# Patient Record
Sex: Male | Born: 1945 | Race: White | Hispanic: No | Marital: Married | Smoking: Never smoker
Health system: Southern US, Community
[De-identification: ages and names within clinical notes are randomized; demographics above are authoritative.]

## PROBLEM LIST (undated history)

## (undated) DIAGNOSIS — Z974 Presence of external hearing-aid: Secondary | ICD-10-CM

## (undated) DIAGNOSIS — Z87442 Personal history of urinary calculi: Secondary | ICD-10-CM

## (undated) DIAGNOSIS — R519 Headache, unspecified: Secondary | ICD-10-CM

## (undated) DIAGNOSIS — R51 Headache: Secondary | ICD-10-CM

## (undated) DIAGNOSIS — Z86718 Personal history of other venous thrombosis and embolism: Secondary | ICD-10-CM

## (undated) DIAGNOSIS — M1990 Unspecified osteoarthritis, unspecified site: Secondary | ICD-10-CM

## (undated) HISTORY — PX: SURGERY OF LIP: SUR1315

## (undated) HISTORY — PX: HERNIA REPAIR: SHX51

## (undated) HISTORY — PX: FOOT SURGERY: SHX648

## (undated) HISTORY — DX: Personal history of other venous thrombosis and embolism: Z86.718

## (undated) HISTORY — PX: KNEE SURGERY: SHX244

## (undated) HISTORY — PX: COLONOSCOPY: SHX174

---

## 2008-01-31 ENCOUNTER — Ambulatory Visit: Payer: Self-pay | Admitting: Surgery

## 2008-02-07 ENCOUNTER — Ambulatory Visit: Payer: Self-pay | Admitting: Surgery

## 2011-05-28 ENCOUNTER — Ambulatory Visit: Payer: Self-pay | Admitting: Anesthesiology

## 2011-05-29 ENCOUNTER — Ambulatory Visit: Payer: Self-pay | Admitting: Podiatry

## 2011-11-19 ENCOUNTER — Emergency Department: Payer: Self-pay | Admitting: Emergency Medicine

## 2011-11-24 ENCOUNTER — Ambulatory Visit: Payer: Self-pay | Admitting: Unknown Physician Specialty

## 2011-11-28 ENCOUNTER — Ambulatory Visit: Payer: Self-pay | Admitting: Unknown Physician Specialty

## 2012-04-18 DIAGNOSIS — R972 Elevated prostate specific antigen [PSA]: Secondary | ICD-10-CM | POA: Insufficient documentation

## 2012-04-18 DIAGNOSIS — N138 Other obstructive and reflux uropathy: Secondary | ICD-10-CM | POA: Insufficient documentation

## 2012-04-18 DIAGNOSIS — N401 Enlarged prostate with lower urinary tract symptoms: Secondary | ICD-10-CM | POA: Insufficient documentation

## 2013-01-08 ENCOUNTER — Ambulatory Visit: Payer: Self-pay | Admitting: Family Medicine

## 2013-01-14 ENCOUNTER — Ambulatory Visit: Payer: Self-pay | Admitting: Internal Medicine

## 2013-09-28 DIAGNOSIS — M7542 Impingement syndrome of left shoulder: Secondary | ICD-10-CM | POA: Insufficient documentation

## 2013-09-28 DIAGNOSIS — M25512 Pain in left shoulder: Secondary | ICD-10-CM | POA: Insufficient documentation

## 2013-10-14 ENCOUNTER — Ambulatory Visit: Payer: Self-pay | Admitting: Gastroenterology

## 2014-07-11 ENCOUNTER — Other Ambulatory Visit: Payer: Self-pay | Admitting: Family Medicine

## 2014-07-11 DIAGNOSIS — R1011 Right upper quadrant pain: Secondary | ICD-10-CM

## 2014-07-18 ENCOUNTER — Ambulatory Visit
Admission: RE | Admit: 2014-07-18 | Discharge: 2014-07-18 | Disposition: A | Discharge status: Home / Self Care | Payer: Medicare Other | Source: Ambulatory Visit | Attending: Family Medicine | Admitting: Family Medicine

## 2014-07-18 DIAGNOSIS — R1011 Right upper quadrant pain: Secondary | ICD-10-CM | POA: Diagnosis not present

## 2014-07-21 ENCOUNTER — Other Ambulatory Visit: Payer: Self-pay | Admitting: Family Medicine

## 2014-07-21 DIAGNOSIS — R1011 Right upper quadrant pain: Secondary | ICD-10-CM

## 2014-07-28 ENCOUNTER — Ambulatory Visit
Admission: RE | Admit: 2014-07-28 | Discharge: 2014-07-28 | Disposition: A | Discharge status: Home / Self Care | Payer: Medicare Other | Source: Ambulatory Visit | Attending: Family Medicine | Admitting: Family Medicine

## 2014-07-28 DIAGNOSIS — R1011 Right upper quadrant pain: Secondary | ICD-10-CM | POA: Insufficient documentation

## 2014-07-28 MED ORDER — TECHNETIUM TC 99M MEBROFENIN IV KIT
5.0000 | PACK | Freq: Once | INTRAVENOUS | Status: AC | PRN
  Administered 2014-07-28: 5 via INTRAVENOUS

## 2014-07-28 MED ORDER — SINCALIDE 5 MCG IJ SOLR
0.0200 ug/kg | Freq: Once | INTRAMUSCULAR | Status: AC
  Administered 2014-07-28: 1.2 ug via INTRAVENOUS

## 2014-09-25 ENCOUNTER — Other Ambulatory Visit: Payer: Self-pay | Admitting: Family Medicine

## 2014-09-25 DIAGNOSIS — R1011 Right upper quadrant pain: Secondary | ICD-10-CM

## 2014-09-29 ENCOUNTER — Ambulatory Visit
Admission: RE | Admit: 2014-09-29 | Discharge: 2014-09-29 | Disposition: A | Discharge status: Home / Self Care | Payer: Medicare Other | Source: Ambulatory Visit | Attending: Family Medicine | Admitting: Family Medicine

## 2014-09-29 ENCOUNTER — Other Ambulatory Visit: Payer: Self-pay | Admitting: Family Medicine

## 2014-09-29 DIAGNOSIS — R1011 Right upper quadrant pain: Secondary | ICD-10-CM | POA: Diagnosis present

## 2014-09-29 DIAGNOSIS — M47814 Spondylosis without myelopathy or radiculopathy, thoracic region: Secondary | ICD-10-CM | POA: Insufficient documentation

## 2014-09-29 DIAGNOSIS — M5134 Other intervertebral disc degeneration, thoracic region: Secondary | ICD-10-CM | POA: Diagnosis not present

## 2014-09-29 DIAGNOSIS — I2510 Atherosclerotic heart disease of native coronary artery without angina pectoris: Secondary | ICD-10-CM | POA: Diagnosis not present

## 2014-09-29 MED ORDER — IOHEXOL 300 MG/ML  SOLN
100.0000 mL | Freq: Once | INTRAMUSCULAR | Status: AC | PRN
  Administered 2014-09-29: 100 mL via INTRAVENOUS

## 2016-03-17 ENCOUNTER — Telehealth: Payer: Self-pay

## 2016-03-17 ENCOUNTER — Other Ambulatory Visit: Payer: Self-pay

## 2016-03-17 DIAGNOSIS — Z86010 Personal history of colonic polyps: Secondary | ICD-10-CM

## 2016-03-17 NOTE — Telephone Encounter (Signed)
Gastroenterology Pre-Procedure Review  Request Date: 03/27/16 Requesting Physician: Dr. Allen Norris  PATIENT REVIEW QUESTIONS: The patient responded to the following health history questions as indicated:    1. Are you having any GI issues? no 2. Do you have a personal history of Polyps? yes (precancerous) 3. Do you have a family history of Colon Cancer or Polyps? no 4. Diabetes Mellitus? no 5. Joint replacements in the past 12 months?no 6. Major health problems in the past 3 months?no 7. Any artificial heart valves, MVP, or defibrillator?no    MEDICATIONS & ALLERGIES:    Patient reports the following regarding taking any anticoagulation/antiplatelet therapy:   Plavix, Coumadin, Eliquis, Xarelto, Lovenox, Pradaxa, Brilinta, or Effient? no Aspirin? no  Patient confirms/reports the following medications:  Current Outpatient Prescriptions  Medication Sig Dispense Refill  . calcium carbonate (CALCIUM 600) 600 MG TABS tablet Take 600 mg by mouth.    . calcium-vitamin D (CALCIUM 500/D) 500-200 MG-UNIT tablet Take by mouth.    . Cholecalciferol (VITAMIN D3) 1000 units CAPS Take by mouth.    . dutasteride (AVODART) 0.5 MG capsule Take 0.5 mg by mouth.    . Glucosamine Sulfate 500 MG TABS Take by mouth.     No current facility-administered medications for this visit.     Patient confirms/reports the following allergies:  Allergies  Allergen Reactions  . Sulfa Antibiotics Rash    No orders of the defined types were placed in this encounter.   AUTHORIZATION INFORMATION Primary Insurance: 1D#: Group #:  Secondary Insurance: 1D#: Group #:  SCHEDULE INFORMATION: Date: 03/27/16 Time: Location: [place]

## 2016-03-17 NOTE — Addendum Note (Signed)
Addended by: Peggye Ley on: 03/17/2016 11:44 AM   Modules accepted: Orders, SmartSet

## 2016-03-24 ENCOUNTER — Encounter: Payer: Self-pay | Admitting: *Deleted

## 2016-03-26 NOTE — Discharge Instructions (Signed)

## 2016-03-27 ENCOUNTER — Ambulatory Visit: Payer: Medicare Other | Admitting: Anesthesiology

## 2016-03-27 ENCOUNTER — Ambulatory Visit
Admission: RE | Admit: 2016-03-27 | Discharge: 2016-03-27 | Disposition: A | Discharge status: Home / Self Care | Payer: Medicare Other | Source: Ambulatory Visit | Attending: Gastroenterology | Admitting: Gastroenterology

## 2016-03-27 ENCOUNTER — Encounter
Admission: RE | Disposition: A | Discharge status: Home / Self Care | Payer: Self-pay | Source: Ambulatory Visit | Attending: Gastroenterology

## 2016-03-27 DIAGNOSIS — K5730 Diverticulosis of large intestine without perforation or abscess without bleeding: Secondary | ICD-10-CM | POA: Insufficient documentation

## 2016-03-27 DIAGNOSIS — K635 Polyp of colon: Secondary | ICD-10-CM

## 2016-03-27 DIAGNOSIS — D125 Benign neoplasm of sigmoid colon: Secondary | ICD-10-CM | POA: Insufficient documentation

## 2016-03-27 DIAGNOSIS — Z1211 Encounter for screening for malignant neoplasm of colon: Secondary | ICD-10-CM | POA: Insufficient documentation

## 2016-03-27 DIAGNOSIS — Z860100 Personal history of colon polyps, unspecified: Secondary | ICD-10-CM

## 2016-03-27 DIAGNOSIS — D122 Benign neoplasm of ascending colon: Secondary | ICD-10-CM | POA: Diagnosis not present

## 2016-03-27 DIAGNOSIS — Z79899 Other long term (current) drug therapy: Secondary | ICD-10-CM | POA: Insufficient documentation

## 2016-03-27 DIAGNOSIS — D123 Benign neoplasm of transverse colon: Secondary | ICD-10-CM

## 2016-03-27 DIAGNOSIS — K641 Second degree hemorrhoids: Secondary | ICD-10-CM | POA: Diagnosis not present

## 2016-03-27 HISTORY — PX: COLONOSCOPY WITH PROPOFOL: SHX5780

## 2016-03-27 HISTORY — DX: Presence of external hearing-aid: Z97.4

## 2016-03-27 HISTORY — PX: POLYPECTOMY: SHX5525

## 2016-03-27 SURGERY — COLONOSCOPY WITH PROPOFOL
Anesthesia: Monitor Anesthesia Care | Wound class: Contaminated

## 2016-03-27 MED ORDER — STERILE WATER FOR IRRIGATION IR SOLN
Status: DC | PRN
  Administered 2016-03-27: 10:00:00

## 2016-03-27 MED ORDER — LACTATED RINGERS IV SOLN
INTRAVENOUS | Status: DC | PRN
  Administered 2016-03-27: 10:00:00 via INTRAVENOUS

## 2016-03-27 MED ORDER — PROPOFOL 10 MG/ML IV BOLUS
INTRAVENOUS | Status: DC | PRN
  Administered 2016-03-27: 10 mg via INTRAVENOUS
  Administered 2016-03-27 (×3): 20 mg via INTRAVENOUS
  Administered 2016-03-27: 70 mg via INTRAVENOUS
  Administered 2016-03-27: 20 mg via INTRAVENOUS

## 2016-03-27 MED ORDER — LIDOCAINE HCL (CARDIAC) 20 MG/ML IV SOLN
INTRAVENOUS | Status: DC | PRN
  Administered 2016-03-27: 40 mg via INTRAVENOUS

## 2016-03-27 SURGICAL SUPPLY — 23 items

## 2016-03-27 NOTE — Anesthesia Procedure Notes (Signed)
Procedure Name: MAC Performed by: Mayme Genta Pre-anesthesia Checklist: Patient identified, Emergency Drugs available, Suction available, Timeout performed and Patient being monitored Patient Re-evaluated:Patient Re-evaluated prior to inductionOxygen Delivery Method: Nasal cannula Placement Confirmation: positive ETCO2

## 2016-03-27 NOTE — H&P (Signed)
  Lucilla Lame, MD [address] [place], [place] [address] Phone: [phone] Fax : [phone]  Primary Care Physician:  Sherrin Daisy, MD Primary Gastroenterologist:  Dr. Allen Norris  Pre-Procedure History & Physical: HPI:  Tyler Woods. is a 71 y.o. male is here for an colonoscopy.   Past Medical History:  Diagnosis Date  . Hearing aid worn    bilateral    Past Surgical History:  Procedure Laterality Date  . COLONOSCOPY    . FOOT SURGERY Right   . HERNIA REPAIR    . KNEE SURGERY Right     Prior to Admission medications   Medication Sig Start Date End Date Taking? Authorizing Provider  calcium carbonate (CALCIUM 600) 600 MG TABS tablet Take 600 mg by mouth.   Yes Historical Provider, MD  calcium-vitamin D (CALCIUM 500/D) 500-200 MG-UNIT tablet Take by mouth.   Yes Historical Provider, MD  Cholecalciferol (VITAMIN D3) 1000 units CAPS Take by mouth.   Yes Historical Provider, MD  dutasteride (AVODART) 0.5 MG capsule Take 0.5 mg by mouth. 05/05/14  Yes Historical Provider, MD  Glucosamine Sulfate 500 MG TABS Take by mouth.   Yes Historical Provider, MD    Allergies as of 03/17/2016 - Review Complete 09/29/2014  Allergen Reaction Noted  . Sulfa antibiotics Rash 09/16/2013    History reviewed. No pertinent family history.  Social History   Social History  . Marital status: Married    Spouse name: N/A  . Number of children: N/A  . Years of education: N/A   Occupational History  . Not on file.   Social History Main Topics  . Smoking status: Never Smoker  . Smokeless tobacco: Never Used  . Alcohol use No  . Drug use: Unknown  . Sexual activity: Not on file   Other Topics Concern  . Not on file   Social History Narrative  . No narrative on file    Review of Systems: See HPI, otherwise negative ROS  Physical Exam: BP (!) 161/92   Pulse 73   Temp 97.5 F (36.4 C) (Temporal)   Ht 5\' 7"  (1.702 m)   Wt 131 lb (59.4 kg)   SpO2 100%    BMI 20.52 kg/m  General:   Alert,  pleasant and cooperative in NAD Head:  Normocephalic and atraumatic. Neck:  Supple; no masses or thyromegaly. Lungs:  Clear throughout to auscultation.    Heart:  Regular rate and rhythm. Abdomen:  Soft, nontender and nondistended. Normal bowel sounds, without guarding, and without rebound.   Neurologic:  Alert and  oriented x4;  grossly normal neurologically.  Impression/Plan: [name]. is here for an colonoscopy to be performed for history of polyps  Risks, benefits, limitations, and alternatives regarding  colonoscopy have been reviewed with the patient.  Questions have been answered.  All parties agreeable.   Lucilla Lame, MD  03/27/2016, 9:25 AM

## 2016-03-27 NOTE — Op Note (Signed)
[facility] Gastroenterology Patient Name: Tyler Woods Procedure Date: 03/27/2016 9:26 AM MRN: [id number] Account #: [id number] Date of Birth: 06-20-1945 Admit Type: Outpatient Age: 71 Room: [facility] OR ROOM 01 Gender: Male Note Status: Finalized Procedure:            Colonoscopy Indications:          Surveillance: Personal history of adenomatous polyps on                        last colonoscopy 3 years ago Providers:            Lucilla Lame MD, MD Referring MD:         Shirline Frees MD, MD (Referring MD) Medicines:            Propofol per Anesthesia Complications:        No immediate complications. Procedure:            Pre-Anesthesia Assessment:                       - Prior to the procedure, a History and Physical was                        performed, and patient medications and allergies were                        reviewed. The patient's tolerance of previous                        anesthesia was also reviewed. The risks and benefits of                        the procedure and the sedation options and risks were                        discussed with the patient. All questions were                        answered, and informed consent was obtained. Prior                        Anticoagulants: The patient has taken no previous                        anticoagulant or antiplatelet agents. ASA Grade                        Assessment: II - A patient with mild systemic disease.                        After reviewing the risks and benefits, the patient was                        deemed in satisfactory condition to undergo the                        procedure.                       After obtaining informed consent, the colonoscope was  passed under direct vision. Throughout the procedure,                        the patient's blood pressure, pulse, and oxygen                        saturations were monitored continuously. The Olympus CF                  H180AL Colonoscope (S#: [id number]) was introduced through                        the anus and advanced to the the cecum, identified by                        appendiceal orifice and ileocecal valve. The                        colonoscopy was performed without difficulty. The                        patient tolerated the procedure well. The quality of                        the bowel preparation was excellent. Findings:      The perianal and digital rectal examinations were normal.      Two sessile polyps were found in the transverse colon. The polyps were 4       to 5 mm in size. These polyps were removed with a cold biopsy forceps.       Resection and retrieval were complete.      A 3 mm polyp was found in the ascending colon. The polyp was sessile.       The polyp was removed with a cold biopsy forceps. Resection and       retrieval were complete.      A 6 mm polyp was found in the sigmoid colon. The polyp was sessile. The       polyp was removed with a cold biopsy forceps. Resection and retrieval       were complete.      Multiple small-mouthed diverticula were found in the sigmoid colon.      Non-bleeding internal hemorrhoids were found during retroflexion. The       hemorrhoids were Grade II (internal hemorrhoids that prolapse but reduce       spontaneously). Impression:           - Two 4 to 5 mm polyps in the transverse colon, removed                        with a cold biopsy forceps. Resected and retrieved.                       - One 3 mm polyp in the ascending colon, removed with a                        cold biopsy forceps. Resected and retrieved.                       - One 6 mm polyp in the sigmoid colon, removed with a  cold biopsy forceps. Resected and retrieved.                       - Diverticulosis in the sigmoid colon.                       - Non-bleeding internal hemorrhoids. Recommendation:       - Discharge patient to home.                        - Resume previous diet.                       - Continue present medications.                       - Await pathology results.                       - Repeat colonoscopy in 5 years for surveillance. Procedure Code(s):    --- Professional ---                       [id number], Colonoscopy, flexible; with biopsy, single or                        multiple Diagnosis Code(s):    --- Professional ---                       Z86.010, Personal history of colonic polyps                       D12.3, Benign neoplasm of transverse colon (hepatic                        flexure or splenic flexure)                       D12.2, Benign neoplasm of ascending colon                       D12.5, Benign neoplasm of sigmoid colon CPT copyright 2016 American Medical Association. All rights reserved. The codes documented in this report are preliminary and upon coder review may  be revised to meet current compliance requirements. Lucilla Lame MD, MD 03/27/2016 9:50:02 AM This report has been signed electronically. Number of Addenda: 0 Note Initiated On: 03/27/2016 9:26 AM Scope Withdrawal Time: 0 hours 8 minutes 9 seconds  Total Procedure Duration: 0 hours 11 minutes 0 seconds       [facility]

## 2016-03-27 NOTE — Anesthesia Postprocedure Evaluation (Signed)
Anesthesia Post Note  Patient: Tyler Woods.  Procedure(s) Performed: Procedure(s) (LRB): COLONOSCOPY WITH PROPOFOL (N/A) POLYPECTOMY  Patient location during evaluation: PACU Anesthesia Type: MAC Level of consciousness: awake and alert Pain management: pain level controlled Vital Signs Assessment: post-procedure vital signs reviewed and stable Respiratory status: spontaneous breathing, nonlabored ventilation, respiratory function stable and patient connected to nasal cannula oxygen Cardiovascular status: stable and blood pressure returned to baseline Anesthetic complications: no    Alisa Graff

## 2016-03-27 NOTE — Anesthesia Preprocedure Evaluation (Signed)
Anesthesia Evaluation  Patient identified by MRN, date of birth, ID band Patient awake    Reviewed: Allergy & Precautions, H&P , NPO status , Patient's Chart, lab work & pertinent test results, reviewed documented beta blocker date and time   Airway Mallampati: II  TM Distance: >3 FB Neck ROM: full    Dental no notable dental hx.    Pulmonary neg pulmonary ROS,    Pulmonary exam normal breath sounds clear to auscultation       Cardiovascular Exercise Tolerance: Good negative cardio ROS   Rhythm:regular Rate:Normal     Neuro/Psych HOH, bilateral hearing aides  negative psych ROS   GI/Hepatic negative GI ROS, Neg liver ROS,   Endo/Other  negative endocrine ROS  Renal/GU negative Renal ROS  negative genitourinary   Musculoskeletal   Abdominal   Peds  Hematology negative hematology ROS (+)   Anesthesia Other Findings   Reproductive/Obstetrics negative OB ROS                             Anesthesia Physical Anesthesia Plan  ASA: II  Anesthesia Plan: MAC   Post-op Pain Management:    Induction:   Airway Management Planned:   Additional Equipment:   Intra-op Plan:   Post-operative Plan:   Informed Consent: I have reviewed the patients History and Physical, chart, labs and discussed the procedure including the risks, benefits and alternatives for the proposed anesthesia with the patient or authorized representative who has indicated his/her understanding and acceptance.   Dental Advisory Given  Plan Discussed with: CRNA  Anesthesia Plan Comments:         Anesthesia Quick Evaluation

## 2016-03-27 NOTE — Transfer of Care (Signed)
Immediate Anesthesia Transfer of Care Note  Patient: Tyler Woods.  Procedure(s) Performed: Procedure(s): COLONOSCOPY WITH PROPOFOL (N/A) POLYPECTOMY  Patient Location: PACU  Anesthesia Type: MAC  Level of Consciousness: awake, alert  and patient cooperative  Airway and Oxygen Therapy: Patient Spontanous Breathing and Patient connected to supplemental oxygen  Post-op Assessment: Post-op Vital signs reviewed, Patient's Cardiovascular Status Stable, Respiratory Function Stable, Patent Airway and No signs of Nausea or vomiting  Post-op Vital Signs: Reviewed and stable  Complications: No apparent anesthesia complications

## 2016-03-28 ENCOUNTER — Encounter: Payer: Self-pay | Admitting: Gastroenterology

## 2016-03-31 ENCOUNTER — Encounter: Payer: Self-pay | Admitting: Gastroenterology

## 2017-07-01 ENCOUNTER — Ambulatory Visit: Payer: Medicare Other | Admitting: Urology

## 2017-07-01 ENCOUNTER — Encounter: Payer: Self-pay | Admitting: Urology

## 2017-07-01 VITALS — BP 152/80 | HR 68 | Ht 67.9 in | Wt 137.1 lb

## 2017-07-01 DIAGNOSIS — R972 Elevated prostate specific antigen [PSA]: Secondary | ICD-10-CM

## 2017-07-01 NOTE — Progress Notes (Signed)
07/01/2017 11:05 AM   Tyler Mart. 06-Oct-1945 [id number]  Referring provider: Sherrin Daisy, MD No address on file  Chief Complaint  Patient presents with  . Benign Prostatic Hypertrophy  . Elevated PSA   Urologic problem list: -Elevated PSA; biopsies 2001 and 2002 for PSAs of 4.2 and 7.8 respectively with benign pathology.  Prostate MRI 05/2015 showed no suspicious lesions  -BPH with lower urinary tract symptoms; started dutasteride 3618  HPI: 72 year old male presents for follow-up of the above problem list.  I last saw him at [facility] in May 2018.  Uncorrected PSA at that visit was stable at 2.59.  He denies bothersome lower urinary tract symptoms.  He has mild intermittency and urinary hesitancy.  Denies dysuria or gross hematuria.  Denies flank, abdominal, pelvic or scrotal pain.   PMH: Past Medical History:  Diagnosis Date  . Hearing aid worn    bilateral  . History of DVT (deep vein thrombosis)     Surgical History: Past Surgical History:  Procedure Laterality Date  . COLONOSCOPY    . COLONOSCOPY WITH PROPOFOL N/A 03/27/2016   Procedure: COLONOSCOPY WITH PROPOFOL;  Surgeon: Lucilla Lame, MD;  Location: [place];  Service: Endoscopy;  Laterality: N/A;  . FOOT SURGERY Right   . HERNIA REPAIR    . KNEE SURGERY Right   . POLYPECTOMY  03/27/2016   Procedure: POLYPECTOMY;  Surgeon: Lucilla Lame, MD;  Location: [facility];  Service: Endoscopy;;    Home Medications:  Allergies as of 07/01/2017      Reactions   Sulfa Antibiotics Rash      Medication List        Accurate as of 07/01/17 11:05 AM. Always use your most recent med list.          CALCIUM 500/D 500-200 MG-UNIT tablet Generic drug:  calcium-vitamin D Take by mouth.   CENTRUM SILVER 50+MEN PO Take by mouth.   dutasteride 0.5 MG capsule Commonly known as:  AVODART Take 0.5 mg by mouth.   Vitamin D3 1000 units Caps Take by mouth.       Allergies:  Allergies    Allergen Reactions  . Sulfa Antibiotics Rash    Family History: Family History  Problem Relation Age of Onset  . Parkinson's disease Father     Social History:  reports that he has never smoked. He has never used smokeless tobacco. He reports that he has current or past drug history. He reports that he does not drink alcohol.  ROS: UROLOGY Frequent Urination?: Yes Hard to postpone urination?: No Burning/pain with urination?: No Get up at night to urinate?: No Leakage of urine?: No Urine stream starts and stops?: Yes Trouble starting stream?: Yes Do you have to strain to urinate?: No Blood in urine?: No Urinary tract infection?: No Sexually transmitted disease?: No Injury to kidneys or bladder?: No Painful intercourse?: No Weak stream?: No Erection problems?: No Penile pain?: No  Gastrointestinal Nausea?: No Vomiting?: No Indigestion/heartburn?: No Diarrhea?: No Constipation?: No  Constitutional Fever: No Night sweats?: No Weight loss?: No Fatigue?: No  Skin Skin rash/lesions?: No Itching?: No  Eyes Blurred vision?: No Double vision?: No  Ears/Nose/Throat Sore throat?: No Sinus problems?: No  Hematologic/Lymphatic Swollen glands?: No Easy bruising?: No  Cardiovascular Leg swelling?: No Chest pain?: No  Respiratory Cough?: No Shortness of breath?: No  Endocrine Excessive thirst?: No  Musculoskeletal Back pain?: No Joint pain?: No  Neurological Headaches?: No Dizziness?: No  Psychologic Depression?: No Anxiety?:  No  Physical Exam: BP (!) 152/80 (BP Location: Left Arm, Patient Position: Sitting, Cuff Size: Normal)   Pulse 68   Ht 5' 7.9" (1.725 m)   Wt 137 lb 1.6 oz (62.2 kg)   SpO2 99%   BMI 20.91 kg/m   Constitutional:  Alert and oriented, No acute distress. HEENT: [place] AT, moist mucus membranes.  Trachea midline, no masses. Cardiovascular: No clubbing, cyanosis, or edema. Respiratory: Normal respiratory effort, no increased  work of breathing. GI: Abdomen is soft, nontender, nondistended, no abdominal masses GU: No CVA tenderness.  Prostate 60 g, smooth without nodules Lymph: No cervical or inguinal lymphadenopathy. Skin: No rashes, bruises or suspicious lesions. Neurologic: Grossly intact, no focal deficits, moving all 4 extremities. Psychiatric: Normal mood and affect.   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  His dutasteride was refilled.  PSA was drawn today and if stable he will continue annual follow-up.    Abbie Sons, [place] [address], [place], [place] [address] [phone]

## 2017-07-02 ENCOUNTER — Telehealth: Payer: Self-pay | Admitting: Urology

## 2017-07-02 LAB — PSA: PROSTATE SPECIFIC AG, SERUM: 2.7 ng/mL (ref 0.0–4.0)

## 2017-07-02 NOTE — Telephone Encounter (Signed)
Pharmacy called office stating pt says a Rx was sent in after his appt. Pharmacy states they have not received a Rx from our office.  Notes indicate " His dutasteride was refilled.  "  Please advise to [facility] and patient. Thanks.

## 2017-07-03 ENCOUNTER — Telehealth: Payer: Self-pay

## 2017-07-03 ENCOUNTER — Encounter: Payer: Self-pay | Admitting: Urology

## 2017-07-03 MED ORDER — DUTASTERIDE 0.5 MG PO CAPS: 0.5000 mg | ORAL_CAPSULE | Freq: Every day | ORAL | 3 refills | Status: DC

## 2017-07-03 NOTE — Telephone Encounter (Signed)
rx has been sent 

## 2017-07-03 NOTE — Telephone Encounter (Signed)
-----   Message from Abbie Sons, MD sent at 07/03/2017  9:19 AM EDT ----- PSA stable at 2.7

## 2017-07-03 NOTE — Telephone Encounter (Signed)
Called pt informed him of lab results.

## 2017-08-25 ENCOUNTER — Other Ambulatory Visit: Payer: Self-pay | Admitting: Unknown Physician Specialty

## 2017-08-25 DIAGNOSIS — M25511 Pain in right shoulder: Secondary | ICD-10-CM

## 2017-09-03 ENCOUNTER — Ambulatory Visit
Admission: RE | Admit: 2017-09-03 | Discharge: 2017-09-03 | Disposition: A | Discharge status: Home / Self Care | Payer: Medicare Other | Source: Ambulatory Visit | Attending: Unknown Physician Specialty | Admitting: Unknown Physician Specialty

## 2017-09-03 DIAGNOSIS — M19011 Primary osteoarthritis, right shoulder: Secondary | ICD-10-CM | POA: Insufficient documentation

## 2017-09-03 DIAGNOSIS — M25511 Pain in right shoulder: Secondary | ICD-10-CM | POA: Insufficient documentation

## 2017-09-03 DIAGNOSIS — M75121 Complete rotator cuff tear or rupture of right shoulder, not specified as traumatic: Secondary | ICD-10-CM | POA: Insufficient documentation

## 2017-09-25 ENCOUNTER — Other Ambulatory Visit: Payer: Self-pay

## 2017-09-25 ENCOUNTER — Encounter
Admission: RE | Admit: 2017-09-25 | Discharge: 2017-09-25 | Disposition: A | Discharge status: Home / Self Care | Payer: Medicare Other | Source: Ambulatory Visit | Attending: Surgery | Admitting: Surgery

## 2017-09-25 DIAGNOSIS — Z01818 Encounter for other preprocedural examination: Secondary | ICD-10-CM | POA: Diagnosis not present

## 2017-09-25 DIAGNOSIS — X58XXXA Exposure to other specified factors, initial encounter: Secondary | ICD-10-CM | POA: Insufficient documentation

## 2017-09-25 DIAGNOSIS — S46091A Other injury of muscle(s) and tendon(s) of the rotator cuff of right shoulder, initial encounter: Secondary | ICD-10-CM | POA: Insufficient documentation

## 2017-09-25 DIAGNOSIS — R001 Bradycardia, unspecified: Secondary | ICD-10-CM | POA: Diagnosis not present

## 2017-09-25 LAB — URINALYSIS, ROUTINE W REFLEX MICROSCOPIC
Bilirubin Urine: NEGATIVE
Glucose, UA: NEGATIVE mg/dL
HGB URINE DIPSTICK: NEGATIVE
KETONES UR: NEGATIVE mg/dL
Leukocytes, UA: NEGATIVE
Nitrite: NEGATIVE
Protein, ur: NEGATIVE mg/dL
Specific Gravity, Urine: 1.019 (ref 1.005–1.030)
pH: 6 (ref 5.0–8.0)

## 2017-09-25 LAB — SURGICAL PCR SCREEN
MRSA, PCR: NEGATIVE
Staphylococcus aureus: NEGATIVE

## 2017-09-25 LAB — CBC
HCT: 38.2 % — ABNORMAL LOW (ref 40.0–52.0)
Hemoglobin: 13.2 g/dL (ref 13.0–18.0)
MCH: 31.9 pg (ref 26.0–34.0)
MCHC: 34.5 g/dL (ref 32.0–36.0)
MCV: 92.5 fL (ref 80.0–100.0)
PLATELETS: 180 10*3/uL (ref 150–440)
RBC: 4.13 MIL/uL — AB (ref 4.40–5.90)
RDW: 13.8 % (ref 11.5–14.5)
WBC: 7.4 10*3/uL (ref 3.8–10.6)

## 2017-09-25 LAB — BASIC METABOLIC PANEL
Anion gap: 4 — ABNORMAL LOW (ref 5–15)
BUN: 17 mg/dL (ref 8–23)
CALCIUM: 9.2 mg/dL (ref 8.9–10.3)
CO2: 29 mmol/L (ref 22–32)
Chloride: 105 mmol/L (ref 98–111)
Creatinine, Ser: 0.72 mg/dL (ref 0.61–1.24)
GFR calc Af Amer: >60 mL/min (ref >60)
Glucose, Bld: 99 mg/dL (ref 70–99)
POTASSIUM: 4.1 mmol/L (ref 3.5–5.1)
SODIUM: 138 mmol/L (ref 135–145)

## 2017-09-25 LAB — TYPE AND SCREEN
ABO/RH(D): A POS
ANTIBODY SCREEN: NEGATIVE

## 2017-09-25 NOTE — Patient Instructions (Signed)
Your procedure is scheduled on: Tues 9/24 Report to Day surgery. To find out your arrival time please call [phone] between 1PM - 3PM on Mon.9/23.  Remember: Instructions that are not followed completely may result in serious medical risk,  up to and including death, or upon the discretion of your surgeon and anesthesiologist your  surgery may need to be rescheduled.     _X__ 1. Do not eat food after midnight the night before your procedure.                 No gum chewing or hard candies. You may drink clear liquids up to 2 hours                 before you are scheduled to arrive for your surgery- DO not drink clear                 liquids within 2 hours of the start of your surgery.                 Clear Liquids include:  water, apple juice without pulp, clear carbohydrate                 drink such as Clearfast of Gatorade, Black Coffee or Tea (Do not add                 anything to coffee or tea).  __X__2.  On the morning of surgery brush your teeth with toothpaste and water, you                may rinse your mouth with mouthwash if you wish.  Do not swallow any toothpaste of mouthwash.     ___ 3.  No Alcohol for 24 hours before or after surgery.   ___ 4.  Do Not Smoke or use e-cigarettes For 24 Hours Prior to Your Surgery.                 Do not use any chewable tobacco products for at least 6 hours prior to                 surgery.  ____  5.  Bring all medications with you on the day of surgery if instructed.   __x__  6.  Notify your doctor if there is any change in your medical condition      (cold, fever, infections).     Do not wear jewelry, make-up, hairpins, clips or nail polish. Do not wear lotions, powders, or perfumes. You may wear deodorant. Do not shave 48 hours prior to surgery. Men may shave face and neck. Do not bring valuables to the hospital.    [facility] is not responsible for any belongings or valuables.  Contacts, dentures or  bridgework may not be worn into surgery. Leave your suitcase in the car. After surgery it may be brought to your room. For patients admitted to the hospital, discharge time is determined by your treatment team.   Patients discharged the day of surgery will not be allowed to drive home.   Please read over the following fact sheets that you were given:    __x__ Take these medicines the morning of surgery with A SIP OF WATER:    1. none  2.   3.   4.  5.  6.  ____ Fleet Enema (as directed)   _x___ Use CHG Soap as directed  ____ Use inhalers on the day of surgery  ____ Stop metformin 2 days prior to surgery    ____ Take 1/2 of usual insulin dose the night before surgery. No insulin the morning          of surgery.   __x__ Stop Excedrin 9/17  __x__ Stop Anti-inflammatories on ibuprofen or motrin or aleve or sdvil   ____ Stop supplements until after surgery.    ____ Bring C-Pap to the hospital.

## 2017-09-27 LAB — URINE CULTURE: CULTURE: NO GROWTH

## 2017-10-05 MED ORDER — CEFAZOLIN SODIUM-DEXTROSE 2-4 GM/100ML-% IV SOLN
2.0000 g | Freq: Once | INTRAVENOUS | Status: AC
  Administered 2017-10-06: 2 g via INTRAVENOUS

## 2017-10-06 ENCOUNTER — Inpatient Hospital Stay
Admission: RE | Admit: 2017-10-06 | Discharge: 2017-10-07 | DRG: 483 | Disposition: A | Discharge status: Home Health | Payer: Medicare Other | Source: Ambulatory Visit | Attending: Surgery | Admitting: Surgery

## 2017-10-06 ENCOUNTER — Inpatient Hospital Stay: Payer: Medicare Other

## 2017-10-06 ENCOUNTER — Other Ambulatory Visit: Payer: Self-pay

## 2017-10-06 ENCOUNTER — Encounter
Admission: RE | Disposition: A | Discharge status: Home Health | Payer: Self-pay | Source: Ambulatory Visit | Attending: Surgery

## 2017-10-06 ENCOUNTER — Inpatient Hospital Stay: Payer: Medicare Other | Admitting: Certified Registered Nurse Anesthetist

## 2017-10-06 DIAGNOSIS — Z86718 Personal history of other venous thrombosis and embolism: Secondary | ICD-10-CM

## 2017-10-06 DIAGNOSIS — Z96611 Presence of right artificial shoulder joint: Secondary | ICD-10-CM

## 2017-10-06 DIAGNOSIS — N400 Enlarged prostate without lower urinary tract symptoms: Secondary | ICD-10-CM | POA: Diagnosis present

## 2017-10-06 DIAGNOSIS — Z974 Presence of external hearing-aid: Secondary | ICD-10-CM | POA: Diagnosis not present

## 2017-10-06 DIAGNOSIS — S46011A Strain of muscle(s) and tendon(s) of the rotator cuff of right shoulder, initial encounter: Principal | ICD-10-CM | POA: Diagnosis present

## 2017-10-06 DIAGNOSIS — X58XXXA Exposure to other specified factors, initial encounter: Secondary | ICD-10-CM | POA: Diagnosis present

## 2017-10-06 HISTORY — PX: REVERSE SHOULDER ARTHROPLASTY: SHX5054

## 2017-10-06 LAB — ABO/RH: ABO/RH(D): A POS

## 2017-10-06 SURGERY — ARTHROPLASTY, SHOULDER, TOTAL, REVERSE
Anesthesia: Regional | Site: Shoulder | Laterality: Right

## 2017-10-06 MED ORDER — ADULT MULTIVITAMIN W/MINERALS CH
1.0000 | ORAL_TABLET | Freq: Every day | ORAL | Status: DC
  Filled 2017-10-06: qty 1

## 2017-10-06 MED ORDER — LIDOCAINE HCL (PF) 2 % IJ SOLN
INTRAMUSCULAR | Status: AC
  Filled 2017-10-06: qty 10

## 2017-10-06 MED ORDER — NEOMYCIN-POLYMYXIN B GU 40-200000 IR SOLN
Status: AC
  Filled 2017-10-06: qty 20

## 2017-10-06 MED ORDER — ASPIRIN-ACETAMINOPHEN-CAFFEINE 250-250-65 MG PO TABS
1.0000 | ORAL_TABLET | Freq: Two times a day (BID) | ORAL | Status: DC | PRN
  Filled 2017-10-06: qty 1

## 2017-10-06 MED ORDER — INFLUENZA VAC SPLIT HIGH-DOSE 0.5 ML IM SUSY
0.5000 mL | PREFILLED_SYRINGE | INTRAMUSCULAR | Status: DC
  Filled 2017-10-06: qty 0.5

## 2017-10-06 MED ORDER — ONDANSETRON HCL 4 MG/2ML IJ SOLN
INTRAMUSCULAR | Status: DC | PRN
  Administered 2017-10-06: 4 mg via INTRAVENOUS

## 2017-10-06 MED ORDER — FENTANYL CITRATE (PF) 100 MCG/2ML IJ SOLN
100.0000 ug | Freq: Once | INTRAMUSCULAR | Status: AC
  Administered 2017-10-06: 50 ug via INTRAVENOUS

## 2017-10-06 MED ORDER — PANTOPRAZOLE SODIUM 40 MG PO TBEC
40.0000 mg | DELAYED_RELEASE_TABLET | Freq: Every day | ORAL | Status: DC
  Administered 2017-10-06: 40 mg via ORAL
  Filled 2017-10-06 (×2): qty 1

## 2017-10-06 MED ORDER — KETOROLAC TROMETHAMINE 15 MG/ML IJ SOLN
7.5000 mg | Freq: Four times a day (QID) | INTRAMUSCULAR | Status: AC
  Administered 2017-10-06 – 2017-10-07 (×4): 7.5 mg via INTRAVENOUS
  Filled 2017-10-06 (×4): qty 1

## 2017-10-06 MED ORDER — CEFAZOLIN SODIUM-DEXTROSE 2-4 GM/100ML-% IV SOLN
2.0000 g | Freq: Four times a day (QID) | INTRAVENOUS | Status: AC
  Administered 2017-10-06 (×2): 2 g via INTRAVENOUS
  Filled 2017-10-06 (×4): qty 100

## 2017-10-06 MED ORDER — FENTANYL CITRATE (PF) 100 MCG/2ML IJ SOLN
INTRAMUSCULAR | Status: AC
  Administered 2017-10-06: 50 ug via INTRAVENOUS
  Filled 2017-10-06: qty 2

## 2017-10-06 MED ORDER — BUPIVACAINE HCL (PF) 0.5 % IJ SOLN
INTRAMUSCULAR | Status: DC | PRN
  Administered 2017-10-06: 10 mL via PERINEURAL

## 2017-10-06 MED ORDER — PHENYLEPHRINE HCL 10 MG/ML IJ SOLN
INTRAMUSCULAR | Status: DC | PRN
  Administered 2017-10-06: 50 ug via INTRAVENOUS
  Administered 2017-10-06: 100 ug via INTRAVENOUS

## 2017-10-06 MED ORDER — LIDOCAINE HCL (CARDIAC) PF 100 MG/5ML IV SOSY
PREFILLED_SYRINGE | INTRAVENOUS | Status: DC | PRN
  Administered 2017-10-06: 100 mg via INTRAVENOUS

## 2017-10-06 MED ORDER — FENTANYL CITRATE (PF) 250 MCG/5ML IJ SOLN
INTRAMUSCULAR | Status: AC
  Filled 2017-10-06: qty 5

## 2017-10-06 MED ORDER — ROCURONIUM BROMIDE 50 MG/5ML IV SOLN
INTRAVENOUS | Status: AC
  Filled 2017-10-06: qty 2

## 2017-10-06 MED ORDER — METOPROLOL TARTRATE 5 MG/5ML IV SOLN
INTRAVENOUS | Status: AC
  Filled 2017-10-06: qty 5

## 2017-10-06 MED ORDER — BUPIVACAINE-EPINEPHRINE (PF) 0.5% -1:200000 IJ SOLN
INTRAMUSCULAR | Status: DC | PRN
  Administered 2017-10-06: 30 mL via PERINEURAL

## 2017-10-06 MED ORDER — ONDANSETRON HCL 4 MG/2ML IJ SOLN: 4.0000 mg | Freq: Once | INTRAMUSCULAR | Status: DC | PRN

## 2017-10-06 MED ORDER — NEOMYCIN-POLYMYXIN B GU 40-200000 IR SOLN
Status: DC | PRN
  Administered 2017-10-06: 14 mL

## 2017-10-06 MED ORDER — VITAMIN D3 25 MCG (1000 UNIT) PO TABS
2000.0000 [IU] | ORAL_TABLET | Freq: Every day | ORAL | Status: DC
  Filled 2017-10-06 (×3): qty 2

## 2017-10-06 MED ORDER — SODIUM CHLORIDE 0.9 % IV SOLN
INTRAVENOUS | Status: DC
  Administered 2017-10-06 (×2): via INTRAVENOUS

## 2017-10-06 MED ORDER — KETOROLAC TROMETHAMINE 15 MG/ML IJ SOLN
INTRAMUSCULAR | Status: AC
  Administered 2017-10-06: 15 mg via INTRAVENOUS
  Filled 2017-10-06: qty 1

## 2017-10-06 MED ORDER — BUPIVACAINE-EPINEPHRINE (PF) 0.5% -1:200000 IJ SOLN
INTRAMUSCULAR | Status: AC
  Filled 2017-10-06: qty 30

## 2017-10-06 MED ORDER — HYDROMORPHONE HCL 1 MG/ML IJ SOLN: 0.2500 mg | INTRAMUSCULAR | Status: DC | PRN

## 2017-10-06 MED ORDER — FAMOTIDINE 20 MG PO TABS
ORAL_TABLET | ORAL | Status: AC
  Filled 2017-10-06: qty 1

## 2017-10-06 MED ORDER — ENOXAPARIN SODIUM 40 MG/0.4ML ~~LOC~~ SOLN
40.0000 mg | SUBCUTANEOUS | Status: DC
  Filled 2017-10-06: qty 0.4

## 2017-10-06 MED ORDER — DIPHENHYDRAMINE HCL 12.5 MG/5ML PO ELIX: 12.5000 mg | ORAL_SOLUTION | ORAL | Status: DC | PRN

## 2017-10-06 MED ORDER — MIDAZOLAM HCL 2 MG/2ML IJ SOLN
INTRAMUSCULAR | Status: AC
  Administered 2017-10-06: 1 mg via INTRAVENOUS
  Filled 2017-10-06: qty 2

## 2017-10-06 MED ORDER — METOCLOPRAMIDE HCL 10 MG PO TABS: 5.0000 mg | ORAL_TABLET | Freq: Three times a day (TID) | ORAL | Status: DC | PRN

## 2017-10-06 MED ORDER — BUPIVACAINE LIPOSOME 1.3 % IJ SUSP
INTRAMUSCULAR | Status: DC | PRN
  Administered 2017-10-06: 20 mL via PERINEURAL

## 2017-10-06 MED ORDER — TRANEXAMIC ACID 1000 MG/10ML IV SOLN
INTRAVENOUS | Status: DC | PRN
  Administered 2017-10-06: 1000 mg via INTRAVENOUS

## 2017-10-06 MED ORDER — TRANEXAMIC ACID 1000 MG/10ML IV SOLN
INTRAVENOUS | Status: AC
  Filled 2017-10-06: qty 10

## 2017-10-06 MED ORDER — LIDOCAINE HCL (PF) 1 % IJ SOLN
INTRAMUSCULAR | Status: AC
  Filled 2017-10-06: qty 5

## 2017-10-06 MED ORDER — PROPOFOL 10 MG/ML IV BOLUS
INTRAVENOUS | Status: AC
  Filled 2017-10-06: qty 20

## 2017-10-06 MED ORDER — MIDAZOLAM HCL 2 MG/2ML IJ SOLN
2.0000 mg | Freq: Once | INTRAMUSCULAR | Status: AC
  Administered 2017-10-06: 1 mg via INTRAVENOUS

## 2017-10-06 MED ORDER — PROPOFOL 10 MG/ML IV BOLUS
INTRAVENOUS | Status: DC | PRN
  Administered 2017-10-06: 160 mg via INTRAVENOUS

## 2017-10-06 MED ORDER — BUPIVACAINE HCL (PF) 0.5 % IJ SOLN
INTRAMUSCULAR | Status: AC
  Filled 2017-10-06: qty 10

## 2017-10-06 MED ORDER — ACETAMINOPHEN 500 MG PO TABS
1000.0000 mg | ORAL_TABLET | Freq: Four times a day (QID) | ORAL | Status: DC
  Administered 2017-10-06 – 2017-10-07 (×3): 1000 mg via ORAL
  Filled 2017-10-06 (×3): qty 2

## 2017-10-06 MED ORDER — SUGAMMADEX SODIUM 200 MG/2ML IV SOLN
INTRAVENOUS | Status: DC | PRN
  Administered 2017-10-06: 200 mg via INTRAVENOUS

## 2017-10-06 MED ORDER — ONDANSETRON HCL 4 MG/2ML IJ SOLN
4.0000 mg | Freq: Four times a day (QID) | INTRAMUSCULAR | Status: DC | PRN
  Administered 2017-10-06: 4 mg via INTRAVENOUS
  Filled 2017-10-06: qty 2

## 2017-10-06 MED ORDER — MAGNESIUM HYDROXIDE 400 MG/5ML PO SUSP: 30.0000 mL | Freq: Every day | ORAL | Status: DC | PRN

## 2017-10-06 MED ORDER — ONDANSETRON HCL 4 MG PO TABS: 4.0000 mg | ORAL_TABLET | Freq: Four times a day (QID) | ORAL | Status: DC | PRN

## 2017-10-06 MED ORDER — FLEET ENEMA 7-19 GM/118ML RE ENEM: 1.0000 | ENEMA | Freq: Once | RECTAL | Status: DC | PRN

## 2017-10-06 MED ORDER — ROCURONIUM BROMIDE 100 MG/10ML IV SOLN
INTRAVENOUS | Status: DC | PRN
  Administered 2017-10-06: 70 mg via INTRAVENOUS
  Administered 2017-10-06: 10 mg via INTRAVENOUS

## 2017-10-06 MED ORDER — FENTANYL CITRATE (PF) 100 MCG/2ML IJ SOLN: 25.0000 ug | INTRAMUSCULAR | Status: DC | PRN

## 2017-10-06 MED ORDER — DUTASTERIDE 0.5 MG PO CAPS
0.5000 mg | ORAL_CAPSULE | Freq: Every day | ORAL | Status: DC
  Administered 2017-10-06: 0.5 mg via ORAL
  Filled 2017-10-06 (×2): qty 1

## 2017-10-06 MED ORDER — METOCLOPRAMIDE HCL 5 MG/ML IJ SOLN
5.0000 mg | Freq: Three times a day (TID) | INTRAMUSCULAR | Status: DC | PRN
  Administered 2017-10-06: 10 mg via INTRAVENOUS
  Filled 2017-10-06: qty 2

## 2017-10-06 MED ORDER — FENTANYL CITRATE (PF) 100 MCG/2ML IJ SOLN
INTRAMUSCULAR | Status: DC | PRN
  Administered 2017-10-06 (×3): 25 ug via INTRAVENOUS
  Administered 2017-10-06: 50 ug via INTRAVENOUS
  Administered 2017-10-06 (×2): 25 ug via INTRAVENOUS

## 2017-10-06 MED ORDER — SUGAMMADEX SODIUM 200 MG/2ML IV SOLN
INTRAVENOUS | Status: AC
  Filled 2017-10-06: qty 2

## 2017-10-06 MED ORDER — OXYCODONE HCL 5 MG PO TABS: 5.0000 mg | ORAL_TABLET | ORAL | Status: DC | PRN

## 2017-10-06 MED ORDER — FAMOTIDINE 20 MG PO TABS
20.0000 mg | ORAL_TABLET | Freq: Once | ORAL | Status: AC
  Administered 2017-10-06: 20 mg via ORAL

## 2017-10-06 MED ORDER — BISACODYL 10 MG RE SUPP: 10.0000 mg | Freq: Every day | RECTAL | Status: DC | PRN

## 2017-10-06 MED ORDER — PHENYLEPHRINE HCL 10 MG/ML IJ SOLN
INTRAMUSCULAR | Status: AC
  Filled 2017-10-06: qty 1

## 2017-10-06 MED ORDER — LIDOCAINE HCL (PF) 1 % IJ SOLN
INTRAMUSCULAR | Status: DC | PRN
  Administered 2017-10-06: 3 mL via SUBCUTANEOUS

## 2017-10-06 MED ORDER — TRAMADOL HCL 50 MG PO TABS: 50.0000 mg | ORAL_TABLET | Freq: Four times a day (QID) | ORAL | Status: DC | PRN

## 2017-10-06 MED ORDER — KETOROLAC TROMETHAMINE 15 MG/ML IJ SOLN
15.0000 mg | Freq: Once | INTRAMUSCULAR | Status: AC
  Administered 2017-10-06: 15 mg via INTRAVENOUS

## 2017-10-06 MED ORDER — BUPIVACAINE LIPOSOME 1.3 % IJ SUSP
INTRAMUSCULAR | Status: AC
  Filled 2017-10-06: qty 20

## 2017-10-06 MED ORDER — ACETAMINOPHEN 325 MG PO TABS: 325.0000 mg | ORAL_TABLET | Freq: Four times a day (QID) | ORAL | Status: DC | PRN

## 2017-10-06 MED ORDER — CEFAZOLIN SODIUM-DEXTROSE 2-4 GM/100ML-% IV SOLN
INTRAVENOUS | Status: AC
  Filled 2017-10-06: qty 100

## 2017-10-06 MED ORDER — DOCUSATE SODIUM 100 MG PO CAPS
100.0000 mg | ORAL_CAPSULE | Freq: Two times a day (BID) | ORAL | Status: DC
  Administered 2017-10-06: 100 mg via ORAL
  Filled 2017-10-06 (×2): qty 1

## 2017-10-06 MED ORDER — METOPROLOL TARTRATE 5 MG/5ML IV SOLN
INTRAVENOUS | Status: DC | PRN
  Administered 2017-10-06 (×2): 2 mg via INTRAVENOUS

## 2017-10-06 MED ORDER — LACTATED RINGERS IV SOLN
INTRAVENOUS | Status: DC
  Administered 2017-10-06 (×2): via INTRAVENOUS

## 2017-10-06 MED ORDER — CALCIUM CARBONATE-VITAMIN D 500-200 MG-UNIT PO TABS
1.0000 | ORAL_TABLET | Freq: Every day | ORAL | Status: DC
  Filled 2017-10-06: qty 1

## 2017-10-06 MED ORDER — ONDANSETRON HCL 4 MG/2ML IJ SOLN
INTRAMUSCULAR | Status: AC
  Filled 2017-10-06: qty 2

## 2017-10-06 MED ORDER — SODIUM CHLORIDE 0.9 % IV SOLN
INTRAVENOUS | Status: DC | PRN
  Administered 2017-10-06: 30 ug/min via INTRAVENOUS

## 2017-10-06 SURGICAL SUPPLY — 64 items
BASEPLATE GLENOSPHERE 25 (Plate) ×2 IMPLANT
BEARING HUMERAL SHLDER 36M STD (Shoulder) ×1 IMPLANT
BIT DRILL TWIST 2.7 (BIT) ×2 IMPLANT
BLADE SAGITTAL WIDE XTHICK NO (BLADE) ×2 IMPLANT
CANISTER SUCT 1200ML W/VALVE (MISCELLANEOUS) ×2 IMPLANT
CANISTER SUCT 3000ML PPV (MISCELLANEOUS) ×4 IMPLANT
CHLORAPREP W/TINT 26ML (MISCELLANEOUS) ×2 IMPLANT
COOLER POLAR GLACIER W/PUMP (MISCELLANEOUS) ×2 IMPLANT
CRADLE LAMINECT ARM (MISCELLANEOUS) ×2 IMPLANT
DRAPE IMP U-DRAPE 54X76 (DRAPES) ×4 IMPLANT
DRAPE INCISE IOBAN 66X45 STRL (DRAPES) ×4 IMPLANT
DRAPE SHEET LG 3/4 BI-LAMINATE (DRAPES) ×4 IMPLANT
DRAPE TABLE BACK 80X90 (DRAPES) ×2 IMPLANT
DRSG OPSITE POSTOP 4X8 (GAUZE/BANDAGES/DRESSINGS) ×2 IMPLANT
ELECT BLADE 6.5 EXT (BLADE) IMPLANT
ELECT CAUTERY BLADE 6.4 (BLADE) ×2 IMPLANT
GLENOID SPHERE STD STRL 36MM (Orthopedic Implant) ×2 IMPLANT
GLOVE BIO SURGEON STRL SZ7.5 (GLOVE) ×8 IMPLANT
GLOVE BIO SURGEON STRL SZ8 (GLOVE) ×8 IMPLANT
GLOVE BIOGEL PI IND STRL 8 (GLOVE) ×1 IMPLANT
GLOVE BIOGEL PI INDICATOR 8 (GLOVE) ×1
GLOVE INDICATOR 8.0 STRL GRN (GLOVE) ×2 IMPLANT
GOWN STRL REUS W/ TWL LRG LVL3 (GOWN DISPOSABLE) ×1 IMPLANT
GOWN STRL REUS W/ TWL XL LVL3 (GOWN DISPOSABLE) ×1 IMPLANT
GOWN STRL REUS W/TWL LRG LVL3 (GOWN DISPOSABLE) ×1
GOWN STRL REUS W/TWL XL LVL3 (GOWN DISPOSABLE) ×1
HOOD PEEL AWAY FLYTE STAYCOOL (MISCELLANEOUS) ×6 IMPLANT
KIT STABILIZATION SHOULDER (MISCELLANEOUS) ×2 IMPLANT
KIT TURNOVER KIT A (KITS) ×2 IMPLANT
MASK FACE SPIDER DISP (MASK) ×2 IMPLANT
MAT ABSORB  FLUID 56X50 GRAY (MISCELLANEOUS) ×1
MAT ABSORB FLUID 56X50 GRAY (MISCELLANEOUS) ×1 IMPLANT
NDL SAFETY ECLIPSE 18X1.5 (NEEDLE) ×1 IMPLANT
NEEDLE HYPO 18GX1.5 SHARP (NEEDLE) ×1
NEEDLE HYPO 22GX1.5 SAFETY (NEEDLE) ×2 IMPLANT
NEEDLE SPNL 20GX3.5 QUINCKE YW (NEEDLE) ×2 IMPLANT
NS IRRIG 500ML POUR BTL (IV SOLUTION) ×2 IMPLANT
PACK ARTHROSCOPY SHOULDER (MISCELLANEOUS) ×2 IMPLANT
PAD WRAPON POLAR SHDR UNIV (MISCELLANEOUS) ×1 IMPLANT
PIN THREADED REVERSE (PIN) ×2 IMPLANT
PULSAVAC PLUS IRRIG FAN TIP (DISPOSABLE) ×2
SCREW BONE LOCKING 4.75X35X3.5 (Screw) ×2 IMPLANT
SCREW BONE STRL 6.5MMX30MM (Screw) ×2 IMPLANT
SCREW CENTRAL 6.5X40 (Screw) ×2 IMPLANT
SCREW LOCKING NS 4.75MMX20MM (Screw) ×2 IMPLANT
SHOULDER HUMERAL BEAR 36M STD (Shoulder) ×2 IMPLANT
SLING ULTRA II M (MISCELLANEOUS) IMPLANT
SOL .9 NS 3000ML IRR  AL (IV SOLUTION) ×1
SOL .9 NS 3000ML IRR UROMATIC (IV SOLUTION) ×1 IMPLANT
SPONGE LAP 18X18 RF (DISPOSABLE) IMPLANT
STAPLER SKIN PROX 35W (STAPLE) ×2 IMPLANT
STEM HUMERAL STRL 13MMX55MM (Stem) ×2 IMPLANT
SUT ETHIBOND 0 MO6 C/R (SUTURE) ×2 IMPLANT
SUT FIBERWIRE #2 38 BLUE 1/2 (SUTURE) ×8
SUT VIC AB 0 CT1 36 (SUTURE) ×2 IMPLANT
SUT VIC AB 2-0 CT1 27 (SUTURE) ×2
SUT VIC AB 2-0 CT1 TAPERPNT 27 (SUTURE) ×2 IMPLANT
SUTURE FIBERWR #2 38 BLUE 1/2 (SUTURE) ×4 IMPLANT
SYR 10ML LL (SYRINGE) ×2 IMPLANT
SYR 30ML LL (SYRINGE) IMPLANT
TIP FAN IRRIG PULSAVAC PLUS (DISPOSABLE) ×1 IMPLANT
TRAY FOLEY MTR SLVR 16FR STAT (SET/KITS/TRAYS/PACK) IMPLANT
TRAY HUM REV SHOULDER STD +6 (Shoulder) ×2 IMPLANT
WRAPON POLAR PAD SHDR UNIV (MISCELLANEOUS) ×2

## 2017-10-06 NOTE — Op Note (Signed)
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  Post-Op Diagnosis:   Same  Procedure:   Reverse right total shoulder arthroplasty.  Surgeon:   Pascal Lux, MD  Assistant:   Cameron Proud, PA-C  Anesthesia:   General endotracheal with an interscalene block placed preoperatively by the anesthesiologist.  Findings:   As above.  Complications:   None  EBL:    250 cc  Fluids:   1100 cc crystalloid  UOP:   None  TT:   None  Drains:   None  Closure:   Staples  Implants:   All press-fit Bi[employer] Comprehensive system with a #13 micro-humeral stem, a +6 mm lateralized 40 mm humeral tray with a standard insert, and a mini-base plate with a 36 mm glenosphere.  Brief Clinical Note:   The patient is a 72 year old male with a long history of right shoulder pain and weakness. His symptoms have persisted despite medications, activity modification, etc. His history and examination are consistent with a massive irreparable rotator cuff tear with cuff arthropathy, all of which were confirmed by MRI scan. The patient presents at this time for a reverse right total shoulder arthroplasty.  Procedure:   The patient underwent placement of an interscalene block by the anesthesiologist in the preoperative holding area before being brought into the operating room and lain in the supine position. The patient then underwent general endotracheal intubation and anesthesia before the patient was repositioned in the beach chair position using the beach chair positioner. The right shoulder and upper extremity were prepped with ChloraPrep solution before being draped sterilely. Preoperative antibiotics were administered. A standard anterior approach to the shoulder was made through an approximately 4-5 inch incision. The incision was carried down through the subcutaneous tissues to  expose the deltopectoral fascia. The interval between the deltoid and pectoralis muscles was identified and this plane developed, retracting the cephalic vein laterally with the deltoid muscle. The conjoined tendon was identified. Its lateral margin was dissected and the Kolbel self-retraining retractor inserted. The "three sisters" were identified and cauterized. Bursal tissues were removed to improve visualization. The subscapularis tendon was released from its attachment to the lesser tuberosity 1 cm proximal to its insertion and several tagging sutures placed. The inferior capsule was released with care after identifying and protecting the axillary nerve. The proximal humeral cut was made at approximately 25 of retroversion using the extra-medullary guide.   Attention was redirected to the glenoid. The labrum was debrided circumferentially before the center of the glenoid was marked with electrocautery. The guidewire was drilled into the glenoid neck using the appropriate guide. After verifying its position, it was overreamed with the mini-baseplate reamer to create a flat surface. The permanent mini-baseplate was impacted into place. It was stabilized with a 30 x 6.5 mm central screw and four peripheral screws. Locking screws were placed superiorly, posteriorly, and inferiorly, while a nonlocking screw was placed anteriorly. The permanent 36 mm glenosphere was then impacted into place and its Morse taper locking mechanism verified using manual distraction.  Attention was directed to the humeral side. The humeral canal was reamed sequentially beginning with the end-cutting reamer then progressing from a 4 mm reamer up to a 13 mm reamer. This provided excellent circumferential chatter. The canal was broached beginning with a #10 broach and progressing to a #13 broach. This was left in place and  a trial reduction performed using the standard trial humeral platform, as well as the +3 mm and +6 mm lateralized  trays.  With the +6 mm lateralized tray, the arm demonstrated excellent range of motion as the hand could be brought across the chest to the opposite shoulder and brought to the top of the patient's head and to the patient's ear. The shoulder appeared stable throughout this range of motion. The joint was dislocated and the trial components removed. The permanent #13 micro-stem was impacted into place with care taken to maintain the appropriate version. The permanent +6 mm lateralized 40 mm humeral platform with the standard insert was put together on the back table and impacted into place. Again, the [facility] taper locking mechanism was verified using manual distraction. The shoulder was relocated using two finger pressure and again placed through a range of motion with the findings as described above.  The wound was copiously irrigated with bacitracin saline solution using the jet lavage system before a total of 30 cc of 0.5% Sensorcaine with epinephrine was injected into the pericapsular and peri-incisional tissues to help with postoperative analgesia. The subscapularis tendon was reapproximated using #2 FiberWire interrupted sutures. The deltopectoral interval was closed using #0 Vicryl interrupted sutures before the subcutaneous tissues were closed using 2-0 Vicryl interrupted sutures. The skin was closed using staples. Prior to closing the skin, 1 g of transexemic acid in 10 cc of normal saline was injected intra-articularly to help with postoperative bleeding. A sterile occlusive dressing was applied to the wound before the arm was placed into a shoulder immobilizer with an abduction pillow. A Polar Care system also was applied to the shoulder. The patient was then transferred back to a hospital bed before being awakened, extubated, and returned to the recovery room in satisfactory condition after tolerating the procedure well.

## 2017-10-06 NOTE — Anesthesia Preprocedure Evaluation (Signed)
Anesthesia Evaluation  Patient identified by MRN, date of birth, ID band Patient awake    Reviewed: Allergy & Precautions, H&P , NPO status , Patient's Chart, lab work & pertinent test results, reviewed documented beta blocker date and time   History of Anesthesia Complications Negative for: history of anesthetic complications  Airway Mallampati: II  TM Distance: >3 FB Neck ROM: full    Dental  (+) Dental Advidsory Given, Teeth Intact   Pulmonary neg pulmonary ROS,           Cardiovascular Exercise Tolerance: Good negative cardio ROS       Neuro/Psych HOH, bilateral hearing aides  negative psych ROS   GI/Hepatic negative GI ROS, Neg liver ROS,   Endo/Other  negative endocrine ROS  Renal/GU negative Renal ROS  negative genitourinary   Musculoskeletal   Abdominal   Peds  Hematology negative hematology ROS (+)   Anesthesia Other Findings Past Medical History: No date: Hearing aid worn     Comment:  bilateral No date: History of DVT (deep vein thrombosis)   Reproductive/Obstetrics negative OB ROS                             Anesthesia Physical  Anesthesia Plan  ASA: II  Anesthesia Plan: General and Regional   Post-op Pain Management: GA combined w/ Regional for post-op pain   Induction: Intravenous  PONV Risk Score and Plan: 2 and Ondansetron, Dexamethasone and Treatment may vary due to age or medical condition  Airway Management Planned: Oral ETT  Additional Equipment:   Intra-op Plan:   Post-operative Plan: Extubation in OR  Informed Consent: I have reviewed the patients History and Physical, chart, labs and discussed the procedure including the risks, benefits and alternatives for the proposed anesthesia with the patient or authorized representative who has indicated his/her understanding and acceptance.   Dental Advisory Given  Plan Discussed with: CRNA  Anesthesia  Plan Comments:         Anesthesia Quick Evaluation

## 2017-10-06 NOTE — NC FL2 (Signed)
[place] LEVEL OF CARE SCREENING TOOL     IDENTIFICATION  Patient Name: Tyler Woods. Birthdate: Dec 02, 1945 Sex: male Admission Date (Current Location): 10/06/2017  [place] and [place] Number:  Engineering geologist and Address:  [facility], [address], [place], [place] [address]      Provider Number: [id number]  Attending Physician Name and Address:  Corky Mull, MD  Relative Name and Phone Number:       Current Level of Care: Hospital Recommended Level of Care: [place] Prior Approval Number:    Date Approved/Denied:   PASRR Number: [id number])  Discharge Plan: SNF    Current Diagnoses: Patient Active Problem List   Diagnosis Date Noted  . Status post reverse total shoulder replacement, right 10/06/2017  . Hx of colonic polyps   . Benign neoplasm of transverse colon   . Benign neoplasm of ascending colon   . Polyp of sigmoid colon   . Impingement syndrome of left shoulder 09/28/2013  . Left shoulder pain 09/28/2013  . Benign prostatic hyperplasia with urinary obstruction 04/18/2012  . BPH with obstruction/lower urinary tract symptoms 04/18/2012  . Abnormal prostate specific antigen 04/18/2012  . Elevated prostate specific antigen (PSA) 04/18/2012  . Enlarged prostate with lower urinary tract symptoms (LUTS) 04/18/2012    Orientation RESPIRATION BLADDER Height & Weight     Self, Time, Place, Situation  Normal Continent Weight: 135 lb (61.2 kg) Height:  5\' 6"  (167.6 cm)  BEHAVIORAL SYMPTOMS/MOOD NEUROLOGICAL BOWEL NUTRITION STATUS      Continent Diet(Diet: Clear Liquid to be Advanced. )  AMBULATORY STATUS COMMUNICATION OF NEEDS Skin   Extensive Assist Verbally Surgical wounds(Incision: Right Shoulder. )                       Personal Care Assistance Level of Assistance  Bathing, Feeding, Dressing Bathing Assistance: Limited assistance Feeding assistance: Independent Dressing  Assistance: Limited assistance     Functional Limitations Info  Sight, Hearing, Speech Sight Info: Impaired Hearing Info: Impaired Speech Info: Adequate    SPECIAL CARE FACTORS FREQUENCY  PT (By licensed PT), OT (By licensed OT)     PT Frequency: (5) OT Frequency: (5)            Contractures      Additional Factors Info  Code Status, Allergies Code Status Info: (Full Code. ) Allergies Info: (Sulfa Antibiotics)           Current Medications (10/06/2017):  This is the current hospital active medication list Current Facility-Administered Medications  Medication Dose Route Frequency Provider Last Rate Last Dose  . 0.9 %  sodium chloride infusion   Intravenous Continuous Poggi, Marshall Cork, MD 75 mL/hr at 10/06/17 1222    . acetaminophen (TYLENOL) tablet 1,000 mg  1,000 mg Oral Q6H Poggi, Marshall Cork, MD      . Derrill Memo ON 10/07/2017] acetaminophen (TYLENOL) tablet 325-650 mg  325-650 mg Oral Q6H PRN Poggi, Marshall Cork, MD      . aspirin-acetaminophen-caffeine (EXCEDRIN MIGRAINE) per tablet 1 tablet  1 tablet Oral BID PRN Poggi, Marshall Cork, MD      . bisacodyl (DULCOLAX) suppository 10 mg  10 mg Rectal Daily PRN Poggi, Marshall Cork, MD      . calcium-vitamin D (OSCAL WITH D) 500-200 MG-UNIT per tablet 1 tablet  1 tablet Oral Daily Poggi, Marshall Cork, MD      . ceFAZolin (ANCEF) IVPB 2g/100 mL premix  2  g Intravenous Q6H Poggi, Marshall Cork, MD 200 mL/hr at 10/06/17 1305 2 g at 10/06/17 1305  . cholecalciferol (VITAMIN D) tablet 2,000 Units  2,000 Units Oral Daily Poggi, Marshall Cork, MD      . diphenhydrAMINE (BENADRYL) 12.5 MG/5ML elixir 12.5-25 mg  12.5-25 mg Oral Q4H PRN Poggi, Marshall Cork, MD      . docusate sodium (COLACE) capsule 100 mg  100 mg Oral BID Poggi, Marshall Cork, MD      . dutasteride (AVODART) capsule 0.5 mg  0.5 mg Oral QHS Poggi, Marshall Cork, MD      . Derrill Memo ON 10/07/2017] enoxaparin (LOVENOX) injection 40 mg  40 mg Subcutaneous Q24H Poggi, Marshall Cork, MD      . HYDROmorphone (DILAUDID) injection 0.25-0.5 mg  0.25-0.5 mg  Intravenous Q4H PRN Poggi, Marshall Cork, MD      . ketorolac (TORADOL) 15 MG/ML injection 7.5 mg  7.5 mg Intravenous Q6H Poggi, Marshall Cork, MD   7.5 mg at 10/06/17 1259  . magnesium hydroxide (MILK OF MAGNESIA) suspension 30 mL  30 mL Oral Daily PRN Poggi, Marshall Cork, MD      . metoCLOPramide (REGLAN) tablet 5-10 mg  5-10 mg Oral Q8H PRN Poggi, Marshall Cork, MD       Or  . metoCLOPramide (REGLAN) injection 5-10 mg  5-10 mg Intravenous Q8H PRN Poggi, Marshall Cork, MD   10 mg at 10/06/17 1445  . multivitamin with minerals tablet 1 tablet  1 tablet Oral Daily Poggi, Marshall Cork, MD      . ondansetron [facility]) tablet 4 mg  4 mg Oral Q6H PRN Poggi, Marshall Cork, MD       Or  . ondansetron [facility]) injection 4 mg  4 mg Intravenous Q6H PRN Poggi, Marshall Cork, MD   4 mg at 10/06/17 1218  . oxyCODONE (Oxy IR/ROXICODONE) immediate release tablet 5-10 mg  5-10 mg Oral Q4H PRN Poggi, Marshall Cork, MD      . pantoprazole (PROTONIX) EC tablet 40 mg  40 mg Oral Daily Poggi, Marshall Cork, MD   40 mg at 10/06/17 1257  . sodium phosphate (FLEET) 7-19 GM/118ML enema 1 enema  1 enema Rectal Once PRN Poggi, Marshall Cork, MD      . traMADol Veat[facility] Bourbon) tablet 50 mg  50 mg Oral Q6H PRN Poggi, Marshall Cork, MD         Discharge Medications: Please see discharge summary for a list of discharge medications.  Relevant Imaging Results:  Relevant Lab Results:   Additional Information (SSN: [id number])  Keileigh Vahey, Veronia Beets, LCSW

## 2017-10-06 NOTE — Anesthesia Procedure Notes (Signed)
Anesthesia Regional Block: Interscalene brachial plexus block   Pre-Anesthetic Checklist: ,, timeout performed, Correct Patient, Correct Site, Correct Laterality, Correct Procedure, Correct Position, site marked, Risks and benefits discussed,  Surgical consent,  Pre-op evaluation,  At surgeon's request and post-op pain management  Laterality: Right and Upper  Prep: chloraprep       Needles:  Injection technique: Single-shot  Needle Type: Echogenic Needle     Needle Length: 10cm  Needle Gauge: 22     Additional Needles:   Procedures:,,,, ultrasound used (permanent image in chart),,,,  Narrative:  Start time: 10/06/2017 7:28 AM End time: 10/06/2017 7:33 AM  Performed by: Personally  Anesthesiologist: Martha Clan, MD

## 2017-10-06 NOTE — Anesthesia Procedure Notes (Signed)
Procedure Name: Intubation Date/Time: 10/06/2017 7:57 AM Performed by: Bernardo Heater, CRNA Pre-anesthesia Checklist: Patient identified, Emergency Drugs available, Suction available and Patient being monitored Patient Re-evaluated:Patient Re-evaluated prior to induction Oxygen Delivery Method: Circle system utilized Preoxygenation: Pre-oxygenation with 100% oxygen Induction Type: IV induction Laryngoscope Size: Mac and 3 Grade View: Grade I Tube size: 7.0 mm Number of attempts: 1 Placement Confirmation: ETT inserted through vocal cords under direct vision,  positive ETCO2 and breath sounds checked- equal and bilateral Secured at: 23 cm Tube secured with: Tape Dental Injury: Teeth and Oropharynx as per pre-operative assessment

## 2017-10-06 NOTE — Progress Notes (Signed)
Pt admitted to 137 via hospital bed without incident per MD order. Pt accompanied by family and wife, Vaughan Basta. Pt/family oriented to room, unit routines, bed alarm, and when and how to call for meals. Pt/family oriented to call bell. Pt and family verbalize understanding of all teachings and agree to comply. Pt denies pain on admission. Sling intact to R shoulder. Polar care intact to R shoulder. Pt has decreased sensation and movement related to block in PACU. Vital signs stable on admission. Rept received from [facility] from PACU. Will continue to monitor.

## 2017-10-06 NOTE — Evaluation (Signed)
Occupational Therapy Evaluation Patient Details Name: Tyler Woods. MRN: [id number] DOB: 1945/08/27 Today's Date: 10/06/2017    History of Present Illness 72yo male pt POD#0 reverse R TSA with a PMHx of a DVT.   Clinical Impression   Patient was seen for an OT evaluation this date. Pt lives with his spouse and small dog in a 1 story home with a basement (doesn't need to use while recovering). Prior to surgery, pt was active and independent. 1 fall in past 12 months leading to this surgery. Pt has orders for RUE to be immobilized and will be NWBing per MD. Patient presents with impaired strength/ROM, pain, and sensation to RUE with block not completely resolved yet. These impairments result in a decreased ability to perform self care tasks requiring mod assist for UB/LB dressing and bathing and max assist for application of polar care, compression stockings, and sling/immobilizer. Pt/spouse instructed in polar care mgt, compression stockings mgt, sling/immobilizer mgt, RUE precautions, adaptive strategies for bathing/dressing/toileting/grooming, positioning and considerations for sleep, and home/routines modifications to maximize falls prevention, safety, and independence. Handout provided. Pt/spouse verbalized understanding of all education/training provided, however, pt limited 2/2 nausea and OT was unable to visually demo sling/polar care this date.  Pt will benefit from skilled OT services to address these limitations and improve independence in daily tasks. Recommend HHOT services to continue therapy to maximize return to PLOF, address home/routines modifications and safety, minimize falls risk, and minimize caregiver burden.    Follow Up Recommendations  Follow surgeon's recommendation for DC plan and follow-up therapies;Home health OT    Equipment Recommendations  None recommended by OT    Recommendations for Other Services       Precautions / Restrictions Precautions Precautions:  Fall;Shoulder Shoulder Interventions: Shoulder sling/immobilizer;Shoulder abduction pillow;At all times;Off for dressing/bathing/exercises Precaution Booklet Issued: Yes (comment) Required Braces or Orthoses: Other Brace/Splint Other Brace/Splint: RUE shoulder sling/immobilizer Restrictions Weight Bearing Restrictions: Yes RUE Weight Bearing: Non weight bearing      Mobility Bed Mobility Overal bed mobility: Needs Assistance Bed Mobility: Supine to Sit;Sit to Supine     Supine to sit: Supervision;HOB elevated Sit to supine: Supervision   General bed mobility comments: additional time/effort, use of bed rail  Transfers Overall transfer level: Needs assistance   Transfers: Lateral/Scoot Transfers          Lateral/Scoot Transfers: Supervision General transfer comment: unable to further assess 2/2 nausea    Balance                                           ADL either performed or assessed with clinical judgement   ADL Overall ADL's : Needs assistance/impaired Eating/Feeding: Sitting;Set up   Grooming: Sitting;Set up   Upper Body Bathing: Sitting;Minimal assistance   Lower Body Bathing: Sit to/from stand;Minimal assistance   Upper Body Dressing : Sitting;Moderate assistance   Lower Body Dressing: Sit to/from stand;Minimal assistance                       Vision Baseline Vision/History: Wears glasses Wears Glasses: At all times Patient Visual Report: No change from baseline       Perception     Praxis      Pertinent Vitals/Pain Pain Assessment: No/denies pain     Hand Dominance Right   Extremity/Trunk Assessment Upper Extremity Assessment Upper Extremity Assessment: RUE deficits/detail(WFL) RUE  Deficits / Details: numbness/tingling in RUE, formal strength testing deferred 2/2 nausea once EOB RUE: Unable to fully assess due to immobilization RUE Sensation: decreased light touch;decreased proprioception RUE Coordination:  decreased gross motor   Lower Extremity Assessment Lower Extremity Assessment: Defer to PT evaluation;Overall [facility] for tasks assessed   Cervical / Trunk Assessment Cervical / Trunk Assessment: Normal   Communication Communication Communication: No difficulties   Cognition Arousal/Alertness: Awake/alert Behavior During Therapy: WFL for tasks assessed/performed Overall Cognitive Status: Within Functional Limits for tasks assessed                                     General Comments  sling/polar care in place at beginning and end of session    Exercises Other Exercises Other Exercises: Pt/spouse instructed in shoulder discharge instructions, modified strategies for ADL tasks, polar care and sling mgt; all with handout provided. Would benefit from additional training/edu to maximize recall/carryover   Shoulder Instructions      Home Living Family/patient expects to be discharged to:: Private residence Living Arrangements: Spouse/significant other Available Help at Discharge: Family;Available 24 hours/day Type of Home: House Home Access: Stairs to enter [employer] of Steps: 2-3 Entrance Stairs-Rails: None Home Layout: One level;Laundry or work area in basement     [employer] Shower/Tub: [facility];Tub/shower unit   Bathroom Toilet: Handicapped height(has both)     Home Equipment: Shower seat - built in          Prior Functioning/Environment Level of Independence: Independent        Comments: Pt indep with mobility and ADL/IADL, + driving, retired but stays active working on projects, denies any other falls aside from fall resulting in shoulder injury approx 6 weeks ago        OT Problem List: Decreased strength;Decreased range of motion;Decreased knowledge of precautions;Impaired sensation;Impaired UE functional use;Decreased knowledge of use of DME or AE      OT Treatment/Interventions: Self-care/ADL training;Balance  training;Therapeutic exercise;Therapeutic activities;DME and/or AE instruction;Patient/family education    OT Goals(Current goals can be found in the care plan section) Acute Rehab OT Goals Patient Stated Goal: go home and return to PLOF OT Goal Formulation: With patient/family Time For Goal Achievement: 10/20/17 Potential to Achieve Goals: Good ADL Goals Pt Will Perform Upper Body Dressing: sitting;with caregiver independent in assisting;with min assist Additional ADL Goal #1: Pt will independently instruct family in polar care mgt including positioning, wear schedule, and donning/doffing Additional ADL Goal #2: Pt will independently instruct family in compression stockings mgt including positioning, wear schedule, and donning/doffing Additional ADL Goal #3: Pt will independently instruct family in RUE shoulder sling/immobilizer mgt including positioning, wear schedule, and donning/doffing  OT Frequency: Min 1X/week   Barriers to D/C:            Co-evaluation              AM-PAC PT "6 Clicks" Daily Activity     Outcome Measure Help from another person eating meals?: None Help from another person taking care of personal grooming?: None Help from another person toileting, which includes using toliet, bedpan, or urinal?: A Little Help from another person bathing (including washing, rinsing, drying)?: A Lot Help from another person to put on and taking off regular upper body clothing?: A Lot Help from another person to put on and taking off regular lower body clothing?: A Little 6 Click Score: 18   End of  Session Nurse Communication: Other (comment)(nausea)  Activity Tolerance: Patient tolerated treatment well;Other (comment)(nauseated) Patient left: in bed;with call bell/phone within reach;with bed alarm set;with family/visitor present;with SCD's reapplied;Other (comment)(polar care and sling in place)  OT Visit Diagnosis: Other abnormalities of gait and mobility (R26.89)                 Time: 7121-9758 OT Time Calculation (min): 20 min Charges:  OT General Charges $OT Visit: 1 Visit OT Evaluation $OT Eval Low Complexity: 1 Low OT Treatments $Self Care/Home Management : 8-22 mins  Jeni Salles, MPH, MS, OTR/L ascom [phone] 10/06/17, 3:45 PM

## 2017-10-06 NOTE — H&P (Signed)
Paper H&P to be scanned into permanent record. H&P reviewed and patient re-examined. No changes. 

## 2017-10-06 NOTE — Anesthesia Post-op Follow-up Note (Signed)
Anesthesia QCDR form completed.        

## 2017-10-06 NOTE — Anesthesia Postprocedure Evaluation (Signed)
Anesthesia Post Note  Patient: Tyler Woods.  Procedure(s) Performed: REVERSE SHOULDER ARTHROPLASTY (Right Shoulder)  Patient location during evaluation: PACU Anesthesia Type: Regional and General Level of consciousness: awake and alert Pain management: pain level controlled Vital Signs Assessment: post-procedure vital signs reviewed and stable Respiratory status: spontaneous breathing, nonlabored ventilation, respiratory function stable and patient connected to nasal cannula oxygen Cardiovascular status: blood pressure returned to baseline and stable Postop Assessment: no apparent nausea or vomiting Anesthetic complications: no     Last Vitals:  Vitals:   10/06/17 1116 10/06/17 1252  BP: 127/84 113/75  Pulse: 65 (!) 52  Resp: 18 18  Temp: 36.5 C (!) 36.3 C  SpO2: 100% 98%    Last Pain:  Vitals:   10/06/17 1252  TempSrc: Oral  PainSc:                  Martha Clan

## 2017-10-06 NOTE — Transfer of Care (Signed)
Immediate Anesthesia Transfer of Care Note  Patient: Tyler Woods.  Procedure(s) Performed: REVERSE SHOULDER ARTHROPLASTY (Right Shoulder)  Patient Location: PACU  Anesthesia Type:General  Level of Consciousness: awake and patient cooperative  Airway & Oxygen Therapy: Patient Spontanous Breathing and Patient connected to nasal cannula oxygen  Post-op Assessment: Report given to RN and Post -op Vital signs reviewed and stable  Post vital signs: Reviewed and stable  Last Vitals:  Vitals Value Taken Time  BP    Temp    Pulse 70 10/06/2017 10:25 AM  Resp 12 10/06/2017 10:25 AM  SpO2 100 % 10/06/2017 10:25 AM  Vitals shown include unvalidated device data.  Last Pain:  Vitals:   10/06/17 0735  TempSrc:   PainSc: 0-No pain         Complications: No apparent anesthesia complications

## 2017-10-06 NOTE — Progress Notes (Signed)
PT Cancellation Note  Patient Details Name: Tyler Woods. MRN: [id number] DOB: [name] 24, 1945   Cancelled Treatment:    Reason Eval/Treat Not Completed: (Consult received and chart reviewed.  Patient currently reporting significant nausea; declines additional attempt at mobility at this time (just completed OT eval; unable to tolerate OOB).  Will re-attempt next date as medically appropriate and available).  Did encourage transition to edge of bed, OOB to chair with nursing this PM as tolerated; patient/wife voiced understanding and agreement.   Deklyn Gibbon H. Owens Shark, PT, DPT, NCS 10/06/17, 2:52 PM [phone]

## 2017-10-07 LAB — BASIC METABOLIC PANEL
Anion gap: 4 — ABNORMAL LOW (ref 5–15)
BUN: 13 mg/dL (ref 8–23)
CO2: 29 mmol/L (ref 22–32)
Calcium: 8.6 mg/dL — ABNORMAL LOW (ref 8.9–10.3)
Chloride: 108 mmol/L (ref 98–111)
Creatinine, Ser: 0.8 mg/dL (ref 0.61–1.24)
GFR calc non Af Amer: >60 mL/min (ref >60)
Glucose, Bld: 100 mg/dL — ABNORMAL HIGH (ref 70–99)
Potassium: 3.7 mmol/L (ref 3.5–5.1)
Sodium: 141 mmol/L (ref 135–145)

## 2017-10-07 MED ORDER — OXYCODONE HCL 5 MG PO TABS: 5.0000 mg | ORAL_TABLET | ORAL | 0 refills | Status: DC | PRN

## 2017-10-07 MED ORDER — TRAMADOL HCL 50 MG PO TABS: 50.0000 mg | ORAL_TABLET | Freq: Four times a day (QID) | ORAL | 0 refills | Status: DC | PRN

## 2017-10-07 NOTE — Discharge Summary (Signed)
Physician Discharge Summary  Patient ID: Tyler Woods. MRN: [id number] DOB/AGE: [birth year] 72 y.o.  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  Discharge Diagnoses: Patient Active Problem List   Diagnosis Date Noted  . Status post reverse total shoulder replacement, right 10/06/2017  . Hx of colonic polyps   . Benign neoplasm of transverse colon   . Benign neoplasm of ascending colon   . Polyp of sigmoid colon   . Impingement syndrome of left shoulder 09/28/2013  . Left shoulder pain 09/28/2013  . Benign prostatic hyperplasia with urinary obstruction 04/18/2012  . BPH with obstruction/lower urinary tract symptoms 04/18/2012  . Abnormal prostate specific antigen 04/18/2012  . Elevated prostate specific antigen (PSA) 04/18/2012  . Enlarged prostate with lower urinary tract symptoms (LUTS) 04/18/2012    Past Medical History:  Diagnosis Date  . Hearing aid worn    bilateral  . History of DVT (deep vein thrombosis)      Transfusion: None.   Consultants (if any):   Discharged Condition: Improved  Hospital Course: Tyler Woods. is an 72 y.o. 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    Surgeries: Procedure(s): REVERSE SHOULDER ARTHROPLASTY on 10/06/2017 Patient tolerated the surgery well. Taken to PACU where she was stabilized and then transferred to the orthopedic floor.  Started on Lovenox 40mg  q 24 hrs. Foot pumps applied bilaterally at 80 mm. Heels elevated on bed with rolled towels. No evidence of DVT. Negative Homan. Physical therapy started on day #1 for gait training  and transfer. OT started day #1 for ADL and assisted devices.  Patient's IV was removed on POD1.  Implants: All press-fit [employer] Comprehensive system with a #13 micro-humeral stem, a +6 mm lateralized 40 mm humeral tray with a standard insert, and a mini-base plate with a 36 mm glenosphere.  He was given perioperative antibiotics:  Anti-infectives (From admission, onward)   Start     Dose/Rate Route Frequency Ordered Stop   10/06/17 1400  ceFAZolin (ANCEF) IVPB 2g/100 mL premix     2 g 200 mL/hr over 30 Minutes Intravenous Every 6 hours 10/06/17 1123 10/07/17 0759   10/06/17 0558  ceFAZolin (ANCEF) 2-4 GM/100ML-% IVPB    Note to Pharmacy:  Norton Blizzard  : cabinet override      10/06/17 0558 10/06/17 0809   10/05/17 2145  ceFAZolin (ANCEF) IVPB 2g/100 mL premix     2 g 200 mL/hr over 30 Minutes Intravenous  Once 10/05/17 2137 10/06/17 0821    .  He was given sequential compression devices, early ambulation, and lovenox for DVT prophylaxis.  He benefited maximally from the hospital stay and there were no complications.    Recent vital signs:  Vitals:   10/07/17 0314 10/07/17 0742  BP: 126/77 (!) 143/78  Pulse: (!) 58 60  Resp: 15 18  Temp: 97.9 F (36.6 C) 97.7 F (36.5 C)  SpO2: 99% 97%    Recent laboratory studies:  Lab Results  Component Value Date   HGB 13.2 09/25/2017   Lab Results  Component Value Date   WBC 7.4 09/25/2017   PLT 180 09/25/2017   No results found for: INR Lab Results  Component Value Date   NA 141 10/07/2017   K 3.7 10/07/2017   CL 108 10/07/2017   CO2 29 10/07/2017   BUN 13 10/07/2017   CREATININE 0.80 10/07/2017   GLUCOSE 100 (H) 10/07/2017    Discharge Medications:   Allergies as of 10/07/2017      Reactions   Sulfa Antibiotics Rash      Medication List    TAKE these medications   aspirin-acetaminophen-caffeine 250-250-65 MG tablet Commonly known as:  EXCEDRIN MIGRAINE Take 1 tablet by mouth 2 (two) times daily as  needed for migraine.   CALCIUM 600+D3 PO Take 1 tablet by mouth daily.   dutasteride 0.5 MG capsule Commonly known as:  AVODART Take 1 capsule (0.5 mg total) by mouth daily. What changed:  when to take this   multivitamin with minerals Tabs tablet Take 1 tablet by mouth daily. Centrum Silver   oxyCODONE 5 MG immediate release tablet Commonly known as:  Oxy IR/ROXICODONE Take 1-2 tablets (5-10 mg total) by mouth every 4 (four) hours as needed for moderate pain.   traMADol 50 MG tablet Commonly known as:  ULTRAM Take 1-2 tablets (50-100 mg total) by mouth every 6 (six) hours as needed for moderate pain.   Vitamin D3 2000 units Tabs Take 2,000 Units by mouth daily.      Diagnostic Studies: Dg Shoulder Right Port  Result Date: 10/06/2017 CLINICAL DATA:  Reverse shoulder replacement. EXAM: PORTABLE RIGHT SHOULDER COMPARISON:  MRI 09/03/2017. FINDINGS: Two limited views of the right shoulder obtained. Surgical staples noted over the right shoulder. Total right shoulder replacement with anatomic alignment. Hardware intact. No acute bony abnormality. IMPRESSION: Total right shoulder replacement with anatomic alignment. Electronically Signed   By: Marcello Moores  Register   On: 10/06/2017 11:55   Disposition: Plan will be for discharge home with HHPT.  Follow-up Information    Lattie Corns, PA-C Follow up in 14 day(s).   Specialty:  Physician Assistant Why:  Electa Sniff information: [place] [id number] [phone]          Signed: Judson Roch PA-C 10/07/2017, 7:53 AM

## 2017-10-07 NOTE — Care Management Note (Signed)
Case Management Note  Patient Details  Name: Tyler Woods. MRN: [id number] Date of Birth: 24-May-1945  Subjective/Objective:    Met with patient and his wife at bedside to discuss transition of care. Patient will discharge home with his wife who will be his caregiver. Provided them with a list of home care agencies. Referral to [facility] for PT/OT. No DME needed. No Lovenox. Discharging today.                 Action/Plan: [facility] for HHPT/OT  Expected Discharge Date:  10/07/17               Expected Discharge Plan:  [place]  In-House Referral:     Discharge planning Services  CM Consult  Post Acute Care Choice:  Home Health Choice offered to:  Patient, Spouse  DME Arranged:    DME Agency:     HH Arranged:  PT, OT HH Agency:  [facility] (formerly [facility])  Status of Service:  Completed, signed off  If discussed at [employer], dates discussed:    Additional Comments:  Jolly Mango, RN 10/07/2017, 9:28 AM

## 2017-10-07 NOTE — Progress Notes (Signed)
  Subjective: 1 Day Post-Op Procedure(s) (LRB): REVERSE SHOULDER ARTHROPLASTY (Right) Patient reports pain as mild.   Patient is well, and has had no acute complaints or problems Plan is to go Home after hospital stay. Negative for chest pain and shortness of breath Fever: no Gastrointestinal:Negative for nausea and vomiting  Objective: Vital signs in last 24 hours: Temp:  [97.3 F (36.3 C)-97.9 F (36.6 C)] 97.7 F (36.5 C) (09/25 0742) Pulse Rate:  [52-72] 60 (09/25 0742) Resp:  [13-21] 18 (09/25 0742) BP: (113-143)/(66-84) 143/78 (09/25 0742) SpO2:  [96 %-100 %] 97 % (09/25 0742) Weight:  [61.2 kg] 61.2 kg (09/24 1500)  Intake/Output from previous day:  Intake/Output Summary (Last 24 hours) at 10/07/2017 0749 Last data filed at 10/07/2017 0300 Gross per 24 hour  Intake 2718.38 ml  Output 250 ml  Net 2468.38 ml    Intake/Output this shift: No intake/output data recorded.  Labs: No results for input(s): HGB in the last 72 hours. No results for input(s): WBC, RBC, HCT, PLT in the last 72 hours. Recent Labs    10/07/17 0636  NA 141  K 3.7  CL 108  CO2 29  BUN 13  CREATININE 0.80  GLUCOSE 100*  CALCIUM 8.6*   No results for input(s): LABPT, INR in the last 72 hours.   EXAM General - Patient is Alert, Appropriate and Oriented Extremity - ABD soft Sensation intact distally Intact pulses distally Incision: dressing C/D/I No cellulitis present  Pt reports intact to light touch over the right deltoid at today's visit. Dressing/Incision - clean, dry, no drainage Motor Function - intact, moving foot and toes well on exam.  Abdomen soft with normal BS this AM.  Past Medical History:  Diagnosis Date  . Hearing aid worn    bilateral  . History of DVT (deep vein thrombosis)     Assessment/Plan: 1 Day Post-Op Procedure(s) (LRB): REVERSE SHOULDER ARTHROPLASTY (Right) Active Problems:   Status post reverse total shoulder replacement, right  Estimated body  mass index is 21.79 kg/m as calculated from the following:   Height as of this encounter: 5\' 6"  (1.676 m).   Weight as of this encounter: 61.2 kg. Advance diet Up with therapy D/C IV fluids when tolerating po intake.  Labs reviewed. Reports improved nausea. Up with therapy today. Plan will be for discharge home after morning session of PT.  DVT Prophylaxis - Lovenox, Foot Pumps and TED hose Non-weightbearing to the right arm.  Raquel James, PA-C [facility] Orthopaedic Surgery 10/07/2017, 7:49 AM

## 2017-10-07 NOTE — Progress Notes (Signed)
Patient is being discharged home with wife. Wife available during instructions. Which included dressing care, TEDs, S&S of infection, and medication. Signed scripts sent with patient. IV removed with cath intact. Allowed time for questions.

## 2017-10-07 NOTE — Discharge Instructions (Signed)
Diet: As you were doing prior to hospitalization  ° °Shower:  May shower but keep the wounds dry, use an occlusive plastic wrap, NO SOAKING IN TUB.  If the bandage gets wet, change with a clean dry gauze. ° °Dressing:  You may change your dressing as needed. Change the dressing with sterile gauze dressing.   ° °Activity:  Increase activity slowly as tolerated, but follow the weight bearing instructions below.  No lifting or driving for 6 weeks. ° °Weight Bearing:   Non-weightbearing to the right arm. ° °Blood Clot Prevention: Take 1 325mg aspirin daily until first follow-up appointment. ° °To prevent constipation: you may use a stool softener such as - ° °Colace (over the counter) 100 mg by mouth twice a day  °Drink plenty of fluids (prune juice may be helpful) and high fiber foods °Miralax (over the counter) for constipation as needed.   ° °Itching:  If you experience itching with your medications, try taking only a single pain pill, or even half a pain pill at a time.  You may take up to 10 pain pills per day, and you can also use benadryl over the counter for itching or also to help with sleep.  ° °Precautions:  If you experience chest pain or shortness of breath - call 911 immediately for transfer to the hospital emergency department!! ° °If you develop a fever greater that 101 F, purulent drainage from wound, increased redness or drainage from wound, or calf pain-Call [facility]                                              °Follow- Up Appointment:  Please call for an appointment to be seen in 2 weeks at [facility] °

## 2017-10-07 NOTE — Evaluation (Signed)
Physical Therapy Evaluation Patient Details Name: Tyler Woods. MRN: [id number] DOB: 1945/01/24 Today's Date: 10/07/2017   History of Present Illness  72yo male pt POD#1 reverse R TSA with a PMHx of a DVT.  Clinical Impression  Patient is s/p above surgery.  Wife present during session.  Demonstrated and educated pt and wife on proper donning and doffing of sling and polar care.  Wife demonstrated understanding of both with min verbal cues. Pt at modified independent for bed mobility and is independent with transfers, ambulation, and stair ascending/descending.  No skilled PT needs identified.  PT will sign off.     Follow Up Recommendations No PT follow up    Equipment Recommendations  None recommended by PT    Recommendations for Other Services       Precautions / Restrictions Precautions Precautions: Fall;Shoulder Shoulder Interventions: Shoulder sling/immobilizer;Shoulder abduction pillow;At all times;Off for dressing/bathing/exercises Required Braces or Orthoses: Other Brace/Splint Other Brace/Splint: RUE shoulder sling/immobilizer Restrictions Weight Bearing Restrictions: Yes RUE Weight Bearing: Non weight bearing      Mobility  Bed Mobility Overal bed mobility: Modified Independent Bed Mobility: Supine to Sit     Supine to sit: Modified independent (Device/Increase time)     General bed mobility comments: Slightly increased time but no physical assist or cues needed.  HOB elevated.   Transfers Overall transfer level: Independent Equipment used: None Transfers: Sit to/from Stand Sit to Stand: Independent         General transfer comment: No physical assist or cues needed  Ambulation/Gait Ambulation/Gait assistance: Independent Gait Distance (Feet): 240 Feet Assistive device: None Gait Pattern/deviations: WFL(Within Functional Limits)     General Gait Details: No gait abnormalities or instability noted.  Educated pt in using caution around turns  and in doorways to avoid bumping R shoulder.   Stairs Stairs: Yes Stairs assistance: Independent Stair Management: No rails;Alternating pattern;Step to pattern(alternating ascending, step to descending) Number of Stairs: 2 General stair comments: No physical assist or cues needed.   Wheelchair Mobility    Modified Rankin (Stroke Patients Only)       Balance Overall balance assessment: Independent                                           Pertinent Vitals/Pain Pain Assessment: No/denies pain    Home Living Family/patient expects to be discharged to:: Private residence Living Arrangements: Spouse/significant other Available Help at Discharge: Family;Available 24 hours/day Type of Home: House Home Access: Stairs to enter Entrance Stairs-Rails: None Entrance Stairs-Number of Steps: 2 Home Layout: One level;Laundry or work area in [place]: Civil engineer, contracting - built in      Prior Function Level of Independence: Independent         Comments: Pt indep with mobility and ADL/IADL, + driving, retired but stays active working on projects, denies any other falls aside from fall resulting in shoulder injury approx 6 weeks ago     Hand Dominance   Dominant Hand: Right    Extremity/Trunk Assessment   Upper Extremity Assessment Upper Extremity Assessment: Defer to OT evaluation    Lower Extremity Assessment Lower Extremity Assessment: Overall WFL for tasks assessed    Cervical / Trunk Assessment Cervical / Trunk Assessment: Normal  Communication   Communication: No difficulties  Cognition Arousal/Alertness: Awake/alert Behavior During Therapy: WFL for tasks assessed/performed Overall Cognitive Status: Within Functional Limits for  tasks assessed                                        General Comments General comments (skin integrity, edema, etc.): Wife present during session.  Demonstrated and educated pt and wife on  proper donning and doffing of sling and polar care.  Wife demonstrated understanding of both with min verbal cues.     Exercises     Assessment/Plan    PT Assessment Patent does not need any further PT services  PT Problem List         PT Treatment Interventions      PT Goals (Current goals can be found in the Care Plan section)  Acute Rehab PT Goals Patient Stated Goal: go home today PT Goal Formulation: All assessment and education complete, DC therapy    Frequency     Barriers to discharge        Co-evaluation               AM-PAC PT "6 Clicks" Daily Activity  Outcome Measure Difficulty turning over in bed (including adjusting bedclothes, sheets and blankets)?: A Little Difficulty moving from lying on back to sitting on the side of the bed? : A Little Difficulty sitting down on and standing up from a chair with arms (e.g., wheelchair, bedside commode, etc,.)?: None Help needed moving to and from a bed to chair (including a wheelchair)?: None Help needed walking in hospital room?: None Help needed climbing 3-5 steps with a railing? : None 6 Click Score: 22    End of Session Equipment Utilized During Treatment: Other (comment)(shoulder sling) Activity Tolerance: Patient tolerated treatment well Patient left: in chair;with call bell/phone within reach;with family/visitor present Nurse Communication: Mobility status PT Visit Diagnosis: Unsteadiness on feet (R26.81)    Time: 6237-6283 PT Time Calculation (min) (ACUTE ONLY): 24 min   Charges:   PT Evaluation $PT Eval Low Complexity: 1 Low PT Treatments $Gait Training: 8-22 mins        Collie Siad PT, DPT 10/07/2017, 10:57 AM

## 2017-10-08 LAB — SURGICAL PATHOLOGY

## 2017-10-09 ENCOUNTER — Telehealth: Payer: Self-pay

## 2017-10-09 NOTE — Telephone Encounter (Signed)
EMMI Follow-up:  Mrs. Newstrom called as they had just received a call from my number. I talked with Mr. Kaspar and he said he was doing good.  We had gotten his Rx's filled and was aware of follow-up appointments.  I let him know there would be a send automated call with a different series of questions and to let us know if he had any concerns at that time.

## 2018-02-15 ENCOUNTER — Ambulatory Visit: Payer: Self-pay | Admitting: Surgery

## 2018-02-15 NOTE — H&P (Signed)
Subjective:   CC: inguinal hernia  HPI:  Tyler Woods is a 73 y.o. male who was referred by Tyler Woods * for evaluation of above. Symptoms were first noted several months ago. Pain is intermittent and discomfort, confined to the right groin, now with radiation down right testicle.  Associated with lump, exacerbated by bending at waist.  Lump is reducible. Patient has no symptoms of  difficulty urinating.    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  Past Surgical History:       Past Surgical History:  Procedure Laterality Date  . Excision Morton's neuroma  05/29/2011   Right foot.  Marland Kitchen HERNIA REPAIR Right 2010   Right femoral hernia repair.  Marland Kitchen KNEE ARTHROSCOPY Right 11/28/2011   Arthroscopic partial medial menisectomy plus debridement of the chondral lesions.  . Reverse right total shoulder arthroplasty Right 10/06/2017   Dr.Poggi     Family History: family history includes Allergies in his mother; Diabetes type II in his brother, brother, father, and sister; High blood pressure (Hypertension) in his father and mother; Myocardial Infarction (Heart attack) in his father; Osteoporosis (Thinning of bones) in his father and mother; Parkinsonism in his father; Stroke in his father and mother.  Social History:  reports that he has never smoked. He has never used smokeless tobacco. He reports that he does not drink alcohol or use drugs.  Current Medications: has a current medication list which includes the following prescription(s): aspirin-acetaminophen-caffeine, calcium carbonate-vitamin d3, cholecalciferol, dutasteride, multivitamin with iron-minerals, and tramadol.  Allergies:       Allergies as of  02/15/2018 - Reviewed 02/15/2018  Allergen Reaction Noted  . Sulfa (sulfonamide antibiotics) Rash 09/16/2013    ROS:  A 15 point review of systems was performed and pertinent positives and negatives noted in HPI   Objective:   BP 138/65   Pulse 73   Ht 162.6 cm (5\' 4" )   Wt 62.1 kg (136 lb 14.5 oz)   BMI 23.50 kg/m   Constitutional :  alert, appears stated age, cooperative and no distress  Lymphatics/Throat:  no asymmetry, masses, or scars  Respiratory:  clear to auscultation bilaterally  Cardiovascular:  regular rate and rhythm, S1, S2 normal, no murmur, click, rub or gallop  Gastrointestinal: soft, non-tender; bowel sounds normal; no masses,  no organomegaly. inguinal hernia noted.  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 Unilateral recurrent inguinal hernia without obstruction or gangrene [K40.91]   Discussed the risk of surgery including recurrence, which can be up to 50% in the case of incisional or complex hernias, possible use of prosthetic materials (mesh) and the increased risk of mesh infxn if used, bleeding, chronic pain, post-op infxn, post-op SBO or ileus, and possible re-operation to address said risks. The risks of general anesthetic, if used, includes MI, CVA, sudden death or even reaction to anesthetic medications also discussed.  Alternatives include continued observation.  Benefits include possible symptom relief, prevention of incarceration, strangulation, enlargement in size over time, and the risk of emergency surgery in the face of strangulation.    Typical post-op recovery time of 3-5 days with 4-6 weeks of activity restrictions were also discussed.  ED return precautions given for sudden increase in pain, size of hernia with accompanying fever, nausea, and/or vomiting.  The patient verbalized understanding and all questions were answered to the patient's satisfaction.   2. Patient has elected to proceed with surgical treatment. Laparoscopic right inguinal hernia repair.   Recommend continuing current meds for medical issues.  Hx of urinary retention.  Will place foley intraop to minimize issues.  Increased risk of postoperative complications due to history but will monitor closely.  No specific intervention needed preop.       Electronically signed by Benjamine Sprague, DO on 02/15/2018 11:11 AM

## 2018-02-18 ENCOUNTER — Other Ambulatory Visit: Payer: Self-pay

## 2018-02-18 ENCOUNTER — Encounter
Admission: RE | Admit: 2018-02-18 | Discharge: 2018-02-18 | Disposition: A | Discharge status: Home / Self Care | Payer: Medicare Other | Source: Ambulatory Visit | Attending: Surgery | Admitting: Surgery

## 2018-02-18 ENCOUNTER — Ambulatory Visit: Payer: Self-pay | Admitting: Surgery

## 2018-02-18 HISTORY — DX: Headache, unspecified: R51.9

## 2018-02-18 HISTORY — DX: Unspecified osteoarthritis, unspecified site: M19.90

## 2018-02-18 HISTORY — DX: Headache: R51

## 2018-02-18 HISTORY — DX: Personal history of urinary calculi: Z87.442

## 2018-02-18 NOTE — Patient Instructions (Signed)
Your procedure is scheduled on: 02-23-18  Report to Same Day Surgery 2nd floor medical mall [facility] Entrance-take elevator on left to 2nd floor.  Check in with surgery information desk.) To find out your arrival time please call [phone] between 1PM - 3PM on 02-22-18  Remember: Instructions that are not followed completely may result in serious medical risk, up to and including death, or upon the discretion of your surgeon and anesthesiologist your surgery may need to be rescheduled.    _x___ 1. Do not eat food after midnight the night before your procedure. You may drink clear liquids up to 2 hours before you are scheduled to arrive at the hospital for your procedure.  Do not drink clear liquids within 2 hours of your scheduled arrival to the hospital.  Clear liquids include  --Water or Apple juice without pulp  --Clear carbohydrate beverage such as ClearFast or Gatorade  --Black Coffee or Clear Tea (No milk, no creamers, do not add anything to the coffee or Tea   ____Ensure clear carbohydrate drink on the way to the hospital for bariatric patients  ____Ensure clear carbohydrate drink 3 hours before surgery for Dr Dwyane Luo patients if physician instructed.   No gum chewing or hard candies.     __x__ 2. No Alcohol for 24 hours before or after surgery.   __x__3. No Smoking or e-cigarettes for 24 prior to surgery.  Do not use any chewable tobacco products for at least 6 hour prior to surgery   ____  4. Bring all medications with you on the day of surgery if instructed.    __x__ 5. Notify your doctor if there is any change in your medical condition     (cold, fever, infections).    x___6. On the morning of surgery brush your teeth with toothpaste and water.  You may rinse your mouth with mouth wash if you wish.  Do not swallow any toothpaste or mouthwash.   Do not wear jewelry, make-up, hairpins, clips or nail polish.  Do not wear lotions, powders, or perfumes. You may wear  deodorant.  Do not shave 48 hours prior to surgery. Men may shave face and neck.  Do not bring valuables to the hospital.    [facility] is not responsible for any belongings or valuables.               Contacts, dentures or bridgework may not be worn into surgery.  Leave your suitcase in the car. After surgery it may be brought to your room.  For patients admitted to the hospital, discharge time is determined by your treatment team.  _  Patients discharged the day of surgery will not be allowed to drive home.  You will need someone to drive you home and stay with you the night of your procedure.    Please read over the following fact sheets that you were given:   [facility] Preparing for Surgery   ____ Take anti-hypertensive listed below, cardiac, seizure, asthma, anti-reflux and psychiatric medicines. These include:  1. NONE  2.  3.  4.  5.  6.  ____Fleets enema or Magnesium Citrate as directed.   _x___ Use CHG Soap or sage wipes as directed on instruction sheet   ____ Use inhalers on the day of surgery and bring to hospital day of surgery  ____ Stop Metformin and Janumet 2 days prior to surgery.    ____ Take 1/2 of usual insulin dose the night before surgery and none  on the morning surgery.   ____ Follow recommendations from Cardiologist, Pulmonologist or PCP regarding stopping Aspirin, Coumadin, Plavix ,Eliquis, Effient, or Pradaxa, and Pletal.  X____Stop Anti-inflammatories such as Advil, Aleve, Ibuprofen, Motrin, Naproxen, Naprosyn, Goodies powders or aspirin products NOW-OK to take Tylenol    ____ Stop supplements until after surgery   ____ Bring C-Pap to the hospital.

## 2018-02-22 MED ORDER — CEFAZOLIN SODIUM-DEXTROSE 2-4 GM/100ML-% IV SOLN
2.0000 g | INTRAVENOUS | Status: AC
  Administered 2018-02-23: 2 g via INTRAVENOUS

## 2018-02-23 ENCOUNTER — Ambulatory Visit: Payer: Medicare Other | Admitting: Anesthesiology

## 2018-02-23 ENCOUNTER — Encounter
Admission: RE | Disposition: A | Discharge status: Home / Self Care | Payer: Self-pay | Source: Home / Self Care | Attending: Surgery

## 2018-02-23 ENCOUNTER — Other Ambulatory Visit: Payer: Self-pay

## 2018-02-23 ENCOUNTER — Ambulatory Visit
Admission: RE | Admit: 2018-02-23 | Discharge: 2018-02-23 | Disposition: A | Discharge status: Home / Self Care | Payer: Medicare Other | Attending: Surgery | Admitting: Surgery

## 2018-02-23 DIAGNOSIS — N401 Enlarged prostate with lower urinary tract symptoms: Secondary | ICD-10-CM | POA: Diagnosis not present

## 2018-02-23 DIAGNOSIS — Z86718 Personal history of other venous thrombosis and embolism: Secondary | ICD-10-CM | POA: Diagnosis not present

## 2018-02-23 DIAGNOSIS — R338 Other retention of urine: Secondary | ICD-10-CM | POA: Insufficient documentation

## 2018-02-23 DIAGNOSIS — Z8249 Family history of ischemic heart disease and other diseases of the circulatory system: Secondary | ICD-10-CM | POA: Insufficient documentation

## 2018-02-23 DIAGNOSIS — K4091 Unilateral inguinal hernia, without obstruction or gangrene, recurrent: Secondary | ICD-10-CM | POA: Insufficient documentation

## 2018-02-23 DIAGNOSIS — Z7982 Long term (current) use of aspirin: Secondary | ICD-10-CM | POA: Diagnosis not present

## 2018-02-23 DIAGNOSIS — Z882 Allergy status to sulfonamides status: Secondary | ICD-10-CM | POA: Insufficient documentation

## 2018-02-23 DIAGNOSIS — Z87442 Personal history of urinary calculi: Secondary | ICD-10-CM | POA: Diagnosis not present

## 2018-02-23 DIAGNOSIS — Z79899 Other long term (current) drug therapy: Secondary | ICD-10-CM | POA: Diagnosis not present

## 2018-02-23 HISTORY — PX: INGUINAL HERNIA REPAIR: SHX194

## 2018-02-23 SURGERY — REPAIR, HERNIA, INGUINAL, LAPAROSCOPIC
Anesthesia: General | Laterality: Right

## 2018-02-23 MED ORDER — OXYCODONE HCL 5 MG PO TABS: 5.0000 mg | ORAL_TABLET | Freq: Once | ORAL | Status: DC | PRN

## 2018-02-23 MED ORDER — ACETAMINOPHEN 500 MG PO TABS
1000.0000 mg | ORAL_TABLET | ORAL | Status: AC
  Administered 2018-02-23: 1000 mg via ORAL

## 2018-02-23 MED ORDER — FAMOTIDINE 20 MG PO TABS
20.0000 mg | ORAL_TABLET | Freq: Once | ORAL | Status: AC
  Administered 2018-02-23: 20 mg via ORAL

## 2018-02-23 MED ORDER — DOCUSATE SODIUM 100 MG PO CAPS: 100.0000 mg | ORAL_CAPSULE | Freq: Two times a day (BID) | ORAL | 0 refills | Status: AC | PRN

## 2018-02-23 MED ORDER — KETOROLAC TROMETHAMINE 30 MG/ML IJ SOLN
INTRAMUSCULAR | Status: DC | PRN
  Administered 2018-02-23: 30 mg via INTRAVENOUS

## 2018-02-23 MED ORDER — HYDROCODONE-ACETAMINOPHEN 5-325 MG PO TABS
1.0000 | ORAL_TABLET | Freq: Once | ORAL | Status: AC
  Administered 2018-02-23: 1 via ORAL

## 2018-02-23 MED ORDER — FENTANYL CITRATE (PF) 100 MCG/2ML IJ SOLN
INTRAMUSCULAR | Status: DC | PRN
  Administered 2018-02-23 (×2): 50 ug via INTRAVENOUS

## 2018-02-23 MED ORDER — IBUPROFEN 800 MG PO TABS: 800.0000 mg | ORAL_TABLET | Freq: Three times a day (TID) | ORAL | 0 refills | Status: DC | PRN

## 2018-02-23 MED ORDER — SUGAMMADEX SODIUM 200 MG/2ML IV SOLN
INTRAVENOUS | Status: DC | PRN
  Administered 2018-02-23: 129.4 mg via INTRAVENOUS

## 2018-02-23 MED ORDER — LACTATED RINGERS IV SOLN
INTRAVENOUS | Status: DC
  Administered 2018-02-23: 11:00:00 via INTRAVENOUS

## 2018-02-23 MED ORDER — SUCCINYLCHOLINE CHLORIDE 20 MG/ML IJ SOLN
INTRAMUSCULAR | Status: DC | PRN
  Administered 2018-02-23: 100 mg via INTRAVENOUS

## 2018-02-23 MED ORDER — CHLORHEXIDINE GLUCONATE CLOTH 2 % EX PADS: 6.0000 | MEDICATED_PAD | Freq: Once | CUTANEOUS | Status: DC

## 2018-02-23 MED ORDER — OXYCODONE HCL 5 MG/5ML PO SOLN: 5.0000 mg | Freq: Once | ORAL | Status: DC | PRN

## 2018-02-23 MED ORDER — PHENYLEPHRINE HCL 10 MG/ML IJ SOLN
INTRAMUSCULAR | Status: DC | PRN
  Administered 2018-02-23 (×2): 100 ug via INTRAVENOUS

## 2018-02-23 MED ORDER — SEVOFLURANE IN SOLN
RESPIRATORY_TRACT | Status: AC
  Filled 2018-02-23: qty 250

## 2018-02-23 MED ORDER — DEXAMETHASONE SODIUM PHOSPHATE 10 MG/ML IJ SOLN
INTRAMUSCULAR | Status: DC | PRN
  Administered 2018-02-23: 10 mg via INTRAVENOUS

## 2018-02-23 MED ORDER — MIDAZOLAM HCL 2 MG/2ML IJ SOLN
INTRAMUSCULAR | Status: AC
  Filled 2018-02-23: qty 2

## 2018-02-23 MED ORDER — PROPOFOL 10 MG/ML IV BOLUS
INTRAVENOUS | Status: DC | PRN
  Administered 2018-02-23: 140 mg via INTRAVENOUS

## 2018-02-23 MED ORDER — CEFAZOLIN SODIUM-DEXTROSE 2-4 GM/100ML-% IV SOLN
INTRAVENOUS | Status: AC
  Filled 2018-02-23: qty 100

## 2018-02-23 MED ORDER — FENTANYL CITRATE (PF) 250 MCG/5ML IJ SOLN
INTRAMUSCULAR | Status: AC
  Filled 2018-02-23: qty 5

## 2018-02-23 MED ORDER — FENTANYL CITRATE (PF) 100 MCG/2ML IJ SOLN: 25.0000 ug | INTRAMUSCULAR | Status: DC | PRN

## 2018-02-23 MED ORDER — LIDOCAINE HCL (CARDIAC) PF 100 MG/5ML IV SOSY
PREFILLED_SYRINGE | INTRAVENOUS | Status: DC | PRN
  Administered 2018-02-23: 100 mg via INTRAVENOUS

## 2018-02-23 MED ORDER — ROCURONIUM BROMIDE 100 MG/10ML IV SOLN
INTRAVENOUS | Status: DC | PRN
  Administered 2018-02-23: 10 mg via INTRAVENOUS
  Administered 2018-02-23: 40 mg via INTRAVENOUS

## 2018-02-23 MED ORDER — ACETAMINOPHEN 500 MG PO TABS
ORAL_TABLET | ORAL | Status: AC
  Administered 2018-02-23: 1000 mg via ORAL
  Filled 2018-02-23: qty 2

## 2018-02-23 MED ORDER — FAMOTIDINE 20 MG PO TABS
ORAL_TABLET | ORAL | Status: AC
  Filled 2018-02-23: qty 1

## 2018-02-23 MED ORDER — MIDAZOLAM HCL 2 MG/2ML IJ SOLN
INTRAMUSCULAR | Status: DC | PRN
  Administered 2018-02-23: 2 mg via INTRAVENOUS

## 2018-02-23 MED ORDER — HYDROCODONE-ACETAMINOPHEN 5-325 MG PO TABS: 1.0000 | ORAL_TABLET | Freq: Four times a day (QID) | ORAL | 0 refills | Status: AC | PRN

## 2018-02-23 MED ORDER — HYDROCODONE-ACETAMINOPHEN 5-325 MG PO TABS
ORAL_TABLET | ORAL | Status: AC
  Filled 2018-02-23: qty 1

## 2018-02-23 MED ORDER — ACETAMINOPHEN 325 MG PO TABS: 650.0000 mg | ORAL_TABLET | Freq: Three times a day (TID) | ORAL | 0 refills | Status: AC | PRN

## 2018-02-23 MED ORDER — MEPERIDINE HCL 50 MG/ML IJ SOLN: 6.2500 mg | INTRAMUSCULAR | Status: DC | PRN

## 2018-02-23 MED ORDER — LIDOCAINE-EPINEPHRINE (PF) 1 %-1:200000 IJ SOLN
INTRAMUSCULAR | Status: DC | PRN
  Administered 2018-02-23: 10 mL

## 2018-02-23 MED ORDER — ONDANSETRON HCL 4 MG/2ML IJ SOLN
INTRAMUSCULAR | Status: DC | PRN
  Administered 2018-02-23: 4 mg via INTRAVENOUS

## 2018-02-23 MED ORDER — GABAPENTIN 300 MG PO CAPS
ORAL_CAPSULE | ORAL | Status: AC
  Administered 2018-02-23: 300 mg via ORAL
  Filled 2018-02-23: qty 1

## 2018-02-23 MED ORDER — PROMETHAZINE HCL 25 MG/ML IJ SOLN: 6.2500 mg | INTRAMUSCULAR | Status: DC | PRN

## 2018-02-23 MED ORDER — GABAPENTIN 300 MG PO CAPS
300.0000 mg | ORAL_CAPSULE | ORAL | Status: AC
  Administered 2018-02-23: 300 mg via ORAL

## 2018-02-23 SURGICAL SUPPLY — 55 items
APPLIER CLIP 5 13 M/L LIGAMAX5 (MISCELLANEOUS)
BLADE SURG 11 STRL SS SAFETY (MISCELLANEOUS) ×3 IMPLANT
CANISTER SUCT 1200ML W/VALVE (MISCELLANEOUS) ×3 IMPLANT
CANNULA DILATOR  5MM W/SLV (CANNULA)
CANNULA DILATOR 10 W/SLV (CANNULA) IMPLANT
CANNULA DILATOR 10MM W/SLV (CANNULA)
CANNULA DILATOR 5 W/SLV (CANNULA) IMPLANT
CHLORAPREP W/TINT 26ML (MISCELLANEOUS) ×3 IMPLANT
CLIP APPLIE 5 13 M/L LIGAMAX5 (MISCELLANEOUS) IMPLANT
COVER WAND RF STERILE (DRAPES) IMPLANT
DEFOGGER SCOPE WARMER CLEARIFY (MISCELLANEOUS) ×3 IMPLANT
DERMABOND ADVANCED (GAUZE/BANDAGES/DRESSINGS) ×2
DERMABOND ADVANCED .7 DNX12 (GAUZE/BANDAGES/DRESSINGS) ×1 IMPLANT
DEVICE SECURE STRAP 25 ABSORB (INSTRUMENTS) ×3 IMPLANT
DEVICE SUTURE ENDOST 10MM (ENDOMECHANICALS) IMPLANT
DISSECT BALLN SPACEMKR OVL PRO (TROCAR) ×3
DISSECTOR BALLN SPCMKR OVL PRO (TROCAR) ×1 IMPLANT
DISSECTOR BLUNT TIP ENDO 5MM (MISCELLANEOUS) IMPLANT
DRAPE LAPAROTOMY 100X77 ABD (DRAPES) ×3 IMPLANT
ELECT REM PT RETURN 9FT ADLT (ELECTROSURGICAL) ×3
ELECTRODE REM PT RTRN 9FT ADLT (ELECTROSURGICAL) ×1 IMPLANT
ENDOLOOP SUT PDS II  0 18 (SUTURE)
ENDOLOOP SUT PDS II 0 18 (SUTURE) IMPLANT
GAUZE SPONGE 4X4 12PLY STRL (GAUZE/BANDAGES/DRESSINGS) IMPLANT
GLOVE BIO SURGEON STRL SZ 6.5 (GLOVE) ×2 IMPLANT
GLOVE BIO SURGEONS STRL SZ 6.5 (GLOVE) ×1
GLOVE BIOGEL PI IND STRL 7.0 (GLOVE) ×1 IMPLANT
GLOVE BIOGEL PI INDICATOR 7.0 (GLOVE) ×2
GOWN STRL REUS W/ TWL LRG LVL3 (GOWN DISPOSABLE) ×1 IMPLANT
GOWN STRL REUS W/TWL LRG LVL3 (GOWN DISPOSABLE) ×2
IRRIGATION STRYKERFLOW (MISCELLANEOUS) IMPLANT
IRRIGATOR STRYKERFLOW (MISCELLANEOUS)
KIT TURNOVER KIT A (KITS) ×3 IMPLANT
LABEL OR SOLS (LABEL) ×3 IMPLANT
MESH 3DMAX 4X6 RT LRG (Mesh General) ×3 IMPLANT
NDL SAFETY ECLIPSE 18X1.5 (NEEDLE) IMPLANT
NEEDLE HYPO 18GX1.5 SHARP (NEEDLE)
NEEDLE HYPO 21X1.5 SAFETY (NEEDLE) IMPLANT
NEEDLE HYPO 22GX1.5 SAFETY (NEEDLE) ×3 IMPLANT
NEEDLE VERESS 14GA 120MM (NEEDLE) IMPLANT
NS IRRIG 500ML POUR BTL (IV SOLUTION) ×3 IMPLANT
PACK LAP CHOLECYSTECTOMY (MISCELLANEOUS) ×3 IMPLANT
SCISSORS METZENBAUM CVD 33 (INSTRUMENTS) IMPLANT
SLEEVE ENDOPATH XCEL 5M (ENDOMECHANICALS) IMPLANT
SUT MNCRL 4-0 (SUTURE) ×4
SUT MNCRL 4-0 27XMFL (SUTURE) ×2
SUT VIC AB 3-0 SH 27 (SUTURE) ×2
SUT VIC AB 3-0 SH 27X BRD (SUTURE) ×1 IMPLANT
SUT VICRYL 0 AB UR-6 (SUTURE) ×6 IMPLANT
SUTURE MNCRL 4-0 27XMF (SUTURE) ×2 IMPLANT
SYR 10ML LL (SYRINGE) ×3 IMPLANT
TOWEL OR 17X26 4PK STRL BLUE (TOWEL DISPOSABLE) ×3 IMPLANT
TROCAR XCEL BLUNT TIP 100MML (ENDOMECHANICALS) IMPLANT
TROCAR XCEL NON-BLD 11X100MML (ENDOMECHANICALS) IMPLANT
TROCAR XCEL NON-BLD 5MMX100MML (ENDOMECHANICALS) IMPLANT

## 2018-02-23 NOTE — Op Note (Addendum)
Preoperative diagnosis: RIGHT DIRECT RECURRENT Inguinal Hernia.  Postoperative diagnosis: RIGHT DIRECT RECURRENT  Inguinal Hernia  Procedure:  Laparoscopic RIGHT DIRECT RECURRENT Inguinal hernia repair with mesh  Anesthesia: General  Surgeon: Dr. [name]  Wound Classification: Clean  Specimen: none  Complications: None  Estimated Blood Loss: 53mL   Indications:  Patient is a 73 y.o. male developed a symptomatic RIGHT DIRECT RECURRENT inguinal hernia. Repair was indicated to avoid complications of incarceration, obstruction and pain, and a prosthetic mesh repair was elected.  Findings: 1. Vas Deferens and cord structures identified and preserved 2. Progrip mesh used for repair 3. Adequate hemostasis achieved  Description of procedure: The patient was taken to the operating room. A time-out was completed verifying correct patient, procedure, site, positioning, and implant(s) and/or special equipment prior to beginning this procedure. The right groin was prepped and draped in the usual sterile fashion. An incision was marked in a natural skin crease and planned to end near the pubic tubercle.  Scrotum wrapped in Kerlix roll.  Foley catheter placed.  Incision was made lateral to the umbilicus down to the anterior fascia.  Anterior fascia was entered and the muscles were retracted to the lateral aspect in order to visualize posterior sheath.  At this point the spacemaker pro dissector was introduced through this incision towards the pubic tubercle.  Under direct visualization the dissector balloon was inflated and held in place for a couple minutes achieve hemostasis.  Prior to insufflation it was noted that the balloon was in the proper space by visualizing the rectus muscles and the superior portion of the camera view.  Afterwards the dissector balloon was removed and the trocar secured in place.  2 additional 5 mm ports were placed in the midline between the initial incision and the pubic  tubercle all under direct visualization.  Local anesthesia  infused to the preplanned incision site prior to insertion of the ports.  Afterwards 2 microdissectors were used to clear the right inguinal region where there was noted to be a moderate sized direct hernia containing a lipoma which was reduced easily.  Dissection of the peritoneal attachment to the spermatic cord structures were also completed.  No evidence of an indirect hernia.  During the dissection process the epigastric vessels were noted as well as the lateral nerves.  After noting adequate dissection and clearing of the area with no obvious bleeding or injuries, Bard 3D max large mesh was placed into the cavity and secured in place with secure straps to cover the direct hernia defect.  Abdomen then desufflated under direct visualization to ensure the mesh remained covering the defect as well as not folding upon itself.  Anterior fascia at the umbilical incision site was then closed with 0 Vicryl in a running fashion.  The deep dermal layer closed with interrupted 3-0 Vicryl.  All the skin incisions were then closed with a subcuticular stitch of Monocryl 4-0. Dermabond was applied.  The testis was gently pulled down into its anatomic position in the scrotum.  Foley catheter removed. The patient tolerated the procedure well and was taken to the postanesthesia care unit in stable condition. Sponge and instrument count correct at end of procedure.

## 2018-02-23 NOTE — Anesthesia Post-op Follow-up Note (Signed)
Anesthesia QCDR form completed.        

## 2018-02-23 NOTE — Progress Notes (Signed)
Patient up to the bathroom with assistance. Voided with no issues. Pt will be discharged home per instructions.

## 2018-02-23 NOTE — Interval H&P Note (Signed)
History and Physical Interval Note:  02/23/2018 11:22 AM  Tyler L Forgue Jr.  has presented today for surgery, with the diagnosis of UNILATERAL RECURRENT INGUINAL HERNIA WITHOUT OBSTRUCTION OR GANGRENE  The various methods of treatment have been discussed with the patient and family. After consideration of risks, benefits and other options for treatment, the patient has consented to  Procedure(s): LAPAROSCOPIC INGUINAL HERNIA RECURRENT (Right) as a surgical intervention .  The patient's history has been reviewed, patient examined, no change in status, stable for surgery.  I have reviewed the patient's chart and labs.  Questions were answered to the patient's satisfaction.     [name] [name]

## 2018-02-23 NOTE — Transfer of Care (Signed)
Immediate Anesthesia Transfer of Care Note  Patient: Tyler Woods.  Procedure(s) Performed: LAPAROSCOPIC INGUINAL HERNIA RECURRENT (Right )  Patient Location: PACU  Anesthesia Type:General  Level of Consciousness: sedated  Airway & Oxygen Therapy: Patient Spontanous Breathing and Patient connected to face mask oxygen  Post-op Assessment: Report given to RN and Post -op Vital signs reviewed and stable  Post vital signs: Reviewed and stable  Last Vitals:  Vitals Value Taken Time  BP 117/80 02/23/2018 12:57 PM  Temp    Pulse 72 02/23/2018 12:58 PM  Resp 11 02/23/2018 12:58 PM  SpO2 100 % 02/23/2018 12:58 PM  Vitals shown include unvalidated device data.  Last Pain:  Vitals:   02/23/18 1050  TempSrc: Oral  PainSc: 0-No pain         Complications: No apparent anesthesia complications

## 2018-02-23 NOTE — Anesthesia Preprocedure Evaluation (Signed)
Anesthesia Evaluation  Patient identified by MRN, date of birth, ID band Patient awake    Reviewed: Allergy & Precautions, NPO status , Patient's Chart, lab work & pertinent test results  History of Anesthesia Complications Negative for: history of anesthetic complications  Airway Mallampati: I  TM Distance: >3 FB Neck ROM: Full    Dental  (+) Poor Dentition   Pulmonary neg pulmonary ROS, neg sleep apnea, neg COPD,    breath sounds clear to auscultation- rhonchi (-) wheezing      Cardiovascular Exercise Tolerance: Good (-) hypertension(-) CAD, (-) Past MI, (-) Cardiac Stents and (-) CABG  Rhythm:Regular Rate:Normal - Systolic murmurs and - Diastolic murmurs    Neuro/Psych  Headaches, negative psych ROS   GI/Hepatic negative GI ROS, Neg liver ROS,   Endo/Other  negative endocrine ROSneg diabetes  Renal/GU negative Renal ROS     Musculoskeletal  (+) Arthritis ,   Abdominal (+) - obese,   Peds  Hematology negative hematology ROS (+)   Anesthesia Other Findings Past Medical History: No date: Arthritis No date: Headache No date: Hearing aid worn     Comment:  bilateral No date: History of DVT (deep vein thrombosis) No date: History of kidney stones     Comment:  h/o   Reproductive/Obstetrics                             Anesthesia Physical Anesthesia Plan  ASA: II  Anesthesia Plan: General   Post-op Pain Management:    Induction: Intravenous  PONV Risk Score and Plan: 1 and Ondansetron and Midazolam  Airway Management Planned: Oral ETT  Additional Equipment:   Intra-op Plan:   Post-operative Plan: Extubation in OR  Informed Consent: I have reviewed the patients History and Physical, chart, labs and discussed the procedure including the risks, benefits and alternatives for the proposed anesthesia with the patient or authorized representative who has indicated his/her  understanding and acceptance.     Dental advisory given  Plan Discussed with: CRNA and Anesthesiologist  Anesthesia Plan Comments:         Anesthesia Quick Evaluation

## 2018-02-23 NOTE — Anesthesia Procedure Notes (Signed)
Procedure Name: Intubation Date/Time: 02/23/2018 11:50 AM Performed by: Nelda Marseille, CRNA Pre-anesthesia Checklist: Patient identified, Patient being monitored, Timeout performed, Emergency Drugs available and Suction available Patient Re-evaluated:Patient Re-evaluated prior to induction Oxygen Delivery Method: Circle System Utilized Preoxygenation: Pre-oxygenation with 100% oxygen Induction Type: IV induction Ventilation: Mask ventilation without difficulty Laryngoscope Size: Mac, 3 and McGraph Grade View: Grade I Tube type: Oral Tube size: 7.5 mm Number of attempts: 1 Airway Equipment and Method: Stylet Placement Confirmation: ETT inserted through vocal cords under direct vision,  positive ETCO2 and breath sounds checked- equal and bilateral Secured at: 21 cm Tube secured with: Tape Dental Injury: Teeth and Oropharynx as per pre-operative assessment  Difficulty Due To: Difficulty was anticipated

## 2018-02-23 NOTE — Discharge Instructions (Addendum)
AMBULATORY SURGERY  DISCHARGE INSTRUCTIONS   1) The drugs that you were given will stay in your system until tomorrow so for the next 24 hours you should not:  A) Drive an automobile B) Make any legal decisions C) Drink any alcoholic beverage   2) You may resume regular meals tomorrow.  Today it is better to start with liquids and gradually work up to solid foods.  You may eat anything you prefer, but it is better to start with liquids, then soup and crackers, and gradually work up to solid foods.   3) Please notify your doctor immediately if you have any unusual bleeding, trouble breathing, redness and pain at the surgery site, drainage, fever, or pain not relieved by medication.    4) Additional Instructions:        Please contact your physician with any problems or Same Day Surgery at [phone], Monday through Friday 6 am to 4 pm, or [place] at [facility] number at [phone].  Laparoscopic Inguinal Hernia Repair, Adult, Care After This sheet gives you information about how to care for yourself after your procedure. Your health care provider may also give you more specific instructions. If you have problems or questions, contact your health care provider. What can I expect after the procedure? After the procedure, it is common to have:  Pain.  Swelling and bruising around the incision area.  Scrotal swelling, in men.  Some fluid or blood draining from your incisions. Follow these instructions at home: Incision care  Follow instructions from your health care provider about how to take care of your incisions. Make sure you: ? Wash your hands with soap and water before you change your bandage (dressing). If soap and water are not available, use hand sanitizer. ? Change your dressing as told by your health care provider. ? Leave stitches (sutures), skin glue, or adhesive strips in place. These skin closures may need to stay in place for 2 weeks or longer. If  adhesive strip edges start to loosen and curl up, you may trim the loose edges. Do not remove adhesive strips completely unless your health care provider tells you to do that.  Check your incision area every day for signs of infection. Check for: ? More redness, swelling, or pain. ? More fluid or blood. ? Warmth. ? Pus or a bad smell.  Wear loose, soft clothing while your incisions heal. Driving  Do not drive or use heavy machinery while taking prescription pain medicine.  Do not drive for 24 hours if you were given a medicine to help you relax (sedative) during your procedure. Activity  Do not lift anything that is heavier than 10 lb (4.5 kg), or the limit that you are told, until your health care provider says that it is safe.  Ask your health care provider what activities are safe for you.A lot of activity during the first week after surgery can increase pain and swelling. For 1 week after your procedure: ? Avoid activities that take a lot of effort, such as exercise or sports. ? You may walk and climb stairs as needed for daily activity, but avoid long walks or climbing stairs for exercise. Managing pain and swelling   Put ice on painful or swollen areas: ? Put ice in a plastic bag. ? Place a towel between your skin and the bag. ? Leave the ice on for 20 minutes, 2-3 times a day. General instructions  Do not take baths, swim, or use a hot  tub until your health care provider approves. Ask your health care provider if you may take showers. You may only be allowed to take sponge baths.  tylenol and advil as needed for discomfort.  Please alternate between the two every four hours as needed for pain.    Use narcotics, if prescribed, only when tylenol and motrin is not enough to control pain.  325-650mg  every 8hrs to max of 3000mg /24hrs (including the 325mg  in every norco dose) for the tylenol.    Advil up to 800mg  per dose every 8hrs as needed for pain.    To prevent or treat  constipation while you are taking prescription pain medicine, your health care provider may recommend that you: ? Drink enough fluid to keep your urine pale yellow. ? Take over-the-counter or prescription medicines. ? Eat foods that are high in fiber, such as fresh fruits and vegetables, whole grains, and beans. ? Limit foods that are high in fat and processed sugars, such as fried and sweet foods.  Do not use any products that contain nicotine or tobacco, such as cigarettes and e-cigarettes. If you need help quitting, ask your health care provider.  Drink enough fluid to keep your urine pale yellow.  Keep all follow-up visits as told by your health care provider. This is important. Contact a health care provider if:  You have more redness, swelling, or pain around your incisions or your groin area.  You have more swelling in your scrotum.  You have more fluid or blood coming from your incisions.  Your incisions feel warm to the touch.  You have severe pain and medicines do not help.  You have abdominal pain or swelling.  You cannot eat or drink without vomiting.  You cannot urinate or pass a bowel movement.  You faint.  You feel dizzy.  You have nausea and vomiting.  You have a fever. Get help right away if:  You have pus or a bad smell coming from your incisions.  You have chest pain.  You have problems breathing. Summary  Pain, swelling, and bruising are common after the procedure.  Check your incision area every day for signs of infection, such as more redness, swelling, or pain.  Put ice on painful or swollen areas for 20 minutes, 2-3 times a day. This information is not intended to replace advice given to you by your health care provider. Make sure you discuss any questions you have with your health care provider. Document Released: 04/10/2016 Document Revised: 04/10/2016 Document Reviewed: 04/10/2016 Elsevier Interactive Patient Education  2019 [employer].

## 2018-02-24 NOTE — Anesthesia Postprocedure Evaluation (Signed)
Anesthesia Post Note  Patient: Tyler Woods.  Procedure(s) Performed: LAPAROSCOPIC INGUINAL HERNIA RECURRENT (Right )  Patient location during evaluation: PACU Anesthesia Type: General Level of consciousness: awake and alert and oriented Pain management: pain level controlled Vital Signs Assessment: post-procedure vital signs reviewed and stable Respiratory status: spontaneous breathing, nonlabored ventilation and respiratory function stable Cardiovascular status: blood pressure returned to baseline and stable Postop Assessment: no signs of nausea or vomiting Anesthetic complications: no     Last Vitals:  Vitals:   02/23/18 1342 02/23/18 1427  BP: 127/80 130/70  Pulse: 65   Resp: 16 16  Temp: (!) 36.2 C   SpO2: 100% 100%    Last Pain:  Vitals:   02/23/18 1443  TempSrc:   PainSc: 3                  Malakhai Beitler

## 2018-03-08 ENCOUNTER — Other Ambulatory Visit (INDEPENDENT_AMBULATORY_CARE_PROVIDER_SITE_OTHER): Payer: Self-pay | Admitting: Nurse Practitioner

## 2018-03-08 ENCOUNTER — Ambulatory Visit (INDEPENDENT_AMBULATORY_CARE_PROVIDER_SITE_OTHER): Payer: Medicare Other

## 2018-03-08 ENCOUNTER — Other Ambulatory Visit: Payer: Self-pay

## 2018-03-08 ENCOUNTER — Encounter (INDEPENDENT_AMBULATORY_CARE_PROVIDER_SITE_OTHER): Payer: Self-pay | Admitting: Nurse Practitioner

## 2018-03-08 ENCOUNTER — Ambulatory Visit (INDEPENDENT_AMBULATORY_CARE_PROVIDER_SITE_OTHER): Payer: Medicare Other | Admitting: Nurse Practitioner

## 2018-03-08 DIAGNOSIS — R600 Localized edema: Secondary | ICD-10-CM

## 2018-03-08 DIAGNOSIS — Z86718 Personal history of other venous thrombosis and embolism: Secondary | ICD-10-CM | POA: Insufficient documentation

## 2018-03-08 DIAGNOSIS — M7989 Other specified soft tissue disorders: Secondary | ICD-10-CM

## 2018-03-08 NOTE — Progress Notes (Signed)
SUBJECTIVE:  Patient ID: Tyler Mart., male    DOB: 1945/02/26, 73 y.o.   MRN: [id number] Chief Complaint  Patient presents with  . Follow-up    possible clot in the R leg    HPI  Tyler Bungert. is a 73 y.o. male that is referred from Dr. [name] with concern for a right lower extremity DVT.  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  The patient recently underwent surgical repair of her hernia.  He stated that he started having pain in his right lower extremity shortly after surgery and it was intense pain.  His wife notes that his leg was also swollen during this time.  Although the patient feels as if it was only minimally.  He denies any fever, chills, nausea, vomiting or diarrhea.  He denies being sedentary for long amounts of time.  He denies any chest pain or shortness of breath.  He denies any TIA-like symptoms.  He underwent a lower extremity venous reflux study today which revealed no evidence of DVT bilaterally.  There was no evidence of superficial venous thrombosis bilaterally.  There was some evidence of chronic venous insufficiency in the left great saphenous vein proximal calf area.  Past Medical History:  Diagnosis Date  . Arthritis   . Headache   . Hearing aid worn    bilateral  . History of DVT (deep vein thrombosis)   . History of kidney stones    h/o    Past Surgical History:  Procedure Laterality Date  . COLONOSCOPY    . COLONOSCOPY WITH PROPOFOL N/A 03/27/2016   Procedure: COLONOSCOPY WITH PROPOFOL;  Surgeon: Lucilla Lame, MD;  Location: [place];  Service: Endoscopy;  Laterality: N/A;  . FOOT SURGERY Right   . HERNIA REPAIR Right    inguinal  . INGUINAL HERNIA REPAIR Right 02/23/2018   Procedure: LAPAROSCOPIC INGUINAL HERNIA RECURRENT;  Surgeon: Benjamine Sprague, DO;  Location: [facility];  Service: General;  Laterality: Right;  . KNEE SURGERY Right   . POLYPECTOMY  03/27/2016   Procedure: POLYPECTOMY;  Surgeon: Lucilla Lame, MD;  Location: [place];  Service: Endoscopy;;  . REVERSE SHOULDER ARTHROPLASTY Right 10/06/2017   Procedure: REVERSE SHOULDER ARTHROPLASTY;  Surgeon: Corky Mull, MD;  Location: [facility];  Service: Orthopedics;  Laterality: Right;  . SURGERY OF LIP      Social History   Socioeconomic History  . Marital status: Married    Spouse name: Not on file  . Number of children: Not on file  . Years of education: Not on file  . Highest education level: Not on file  Occupational History  . Not on file  Social Needs  . Financial resource strain: Not on file  . Food insecurity:    Worry: Not on file    Inability: Not on file  . Transportation needs:    Medical: Not on file    Non-medical: Not on file  Tobacco Use  . Smoking status: Never Smoker  . Smokeless tobacco: Never Used  Substance and Sexual Activity  . Alcohol use: No  . Drug use: Not Currently  . Sexual activity: Yes    Birth control/protection: None  Lifestyle  . Physical activity:    Days per week: Not on file    Minutes per session: Not on file  . Stress: Not on file  Relationships  . Social connections:    Talks on phone:  Not on file    Gets together: Not on file    Attends religious service: Not on file    Active member of club or organization: Not on file    Attends meetings of clubs or organizations: Not on file    Relationship status: Not on file  . Intimate partner violence:    Fear of current or ex partner: Not on file    Emotionally abused: Not on file    Physically abused: Not on file    Forced sexual activity: Not on file  Other Topics Concern  . Not on file  Social History Narrative  . Not on file    Family History  Problem Relation Age of Onset  . Parkinson's disease Father     Allergies  Allergen Reactions  . Sulfa Antibiotics Rash     Review of Systems   Review of Systems: Negative Unless Checked Constitutional: [] Weight loss   [] Fever  [] Chills Cardiac: [] Chest pain   []  Atrial Fibrillation  [] Palpitations   [] Shortness of breath when laying flat   [] Shortness of breath with exertion. [] Shortness of breath at rest Vascular:  [] Pain in legs with walking   [] Pain in legs with standing [] Pain in legs when laying flat   [] Claudication    [] Pain in feet when laying flat    [x] History of DVT   [] Phlebitis   [x] Swelling in legs   [] Varicose veins   [] Non-healing ulcers Pulmonary:   [] Uses home oxygen   [] Productive cough   [] Hemoptysis   [] Wheeze  [] COPD   [] Asthma Neurologic:  [] Dizziness   [] Seizures  [] Blackouts [] History of stroke   [] History of TIA  [] Aphasia   [] Temporary Blindness   [] Weakness or numbness in arm   [] Weakness or numbness in leg Musculoskeletal:   [x] Joint swelling   [x] Joint pain   [] Low back pain  []  History of Knee Replacement [] Arthritis [] back Surgeries  []  Spinal Stenosis    Hematologic:  [] Easy bruising  [] Easy bleeding   [] Hypercoagulable state   [] Anemic Gastrointestinal:  [] Diarrhea   [] Vomiting  [] Gastroesophageal reflux/heartburn   [] Difficulty swallowing. [] Abdominal pain Genitourinary:  [] Chronic kidney disease   [] Difficult urination  [] Anuric   [] Blood in urine [] Frequent urination  [] Burning with urination   [] Hematuria Skin:  [] Rashes   [] Ulcers [] Wounds Psychological:  [] History of anxiety   []  History of major depression  []  Memory Difficulties      OBJECTIVE:   Physical Exam  BP 124/69 (BP Location: Left Arm, Patient Position: Sitting, Cuff Size: Small)   Pulse 63   Resp 10   Ht 5\' 6"  (1.676 m)   Wt 132 lb (59.9 kg)   BMI 21.31 kg/m   Gen: WD/WN, NAD Head: [place]/AT, No temporalis wasting.  Ear/Nose/Throat: Hearing grossly intact, nares w/o erythema or drainage Eyes: PER, EOMI, sclera nonicteric.  Neck: Supple, no masses.  No JVD.  Pulmonary:  Good air movement, no use of accessory muscles.  Cardiac: RRR Vascular: no swelling present Vessel Right Left  Radial Palpable  Palpable  Dorsalis Pedis Palpable Palpable  Posterior Tibial Palpable Palpable   Gastrointestinal: soft, non-distended. No guarding/no peritoneal signs.  Musculoskeletal: M/S 5/5 throughout.  No deformity or atrophy.  Neurologic: Pain and light touch intact in extremities.  Symmetrical.  Speech is fluent. Motor exam as listed above. Psychiatric: Judgment intact, Mood & affect appropriate for pt's clinical situation. Dermatologic: No Venous rashes. No Ulcers Noted.  No changes consistent with cellulitis. Lymph : No Cervical lymphadenopathy, no  lichenification or skin changes of chronic lymphedema.       ASSESSMENT AND PLAN:  1. Right leg swelling Currently the patient does not have a right lower extremity DVT.  The patient is also been pain-free for approximately 1 week.  Advised the patient that should the swelling returned and he is welcome to give [place] a call and we can look for a DVT once again.  Also spoke with the patient about signs and symptoms of DVT such as pain, swelling, redness, numbness and or cramping.  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  2. History of DVT of lower extremity DVT has been gone for many years.  Suffers little post-phlebetic swelling and changes.  Discussed utilizing medical grade 1 compression stockings on a daily basis with the patient and his wife.  Discussed with him that he should placed him on first thing in the morning and take them off prior to bed.  He should also elevate his lower extremities as much as possible.   Current Outpatient Medications on File Prior to Visit  Medication Sig Dispense Refill  . acetaminophen (TYLENOL) 325 MG tablet Take 2 tablets (650 mg total) by mouth every 8 (eight) hours as needed for up to 30 days for mild pain. 40 tablet 0  . Calcium Carb-Cholecalciferol (CALCIUM 600+D3 PO) Take 1 tablet by mouth daily.    . Cholecalciferol (VITAMIN D3) 2000 units TABS Take 2,000 Units by  mouth daily.    . Cyanocobalamin (VITAMIN B-12) 5000 MCG TBDP Take 5,000 mcg by mouth daily.    Marland Kitchen dutasteride (AVODART) 0.5 MG capsule Take 1 capsule (0.5 mg total) by mouth daily. (Patient taking differently: Take 0.5 mg by mouth at bedtime. ) 90 capsule 3  . ibuprofen (ADVIL,MOTRIN) 800 MG tablet Take 1 tablet (800 mg total) by mouth every 8 (eight) hours as needed for mild pain or moderate pain. 30 tablet 0  . Multiple Vitamin (MULTIVITAMIN WITH MINERALS) TABS tablet Take 1 tablet by mouth daily. Centrum Silver     No current facility-administered medications on file prior to visit.     There are no Patient Instructions on file for this visit. No follow-ups on file.   Kris Hartmann, NP  This note was completed with Sales executive.  Any errors are purely unintentional.

## 2018-07-02 ENCOUNTER — Ambulatory Visit: Payer: Medicare Other | Admitting: Urology

## 2018-07-30 ENCOUNTER — Encounter: Payer: Self-pay | Admitting: Urology

## 2018-07-30 ENCOUNTER — Ambulatory Visit (INDEPENDENT_AMBULATORY_CARE_PROVIDER_SITE_OTHER): Payer: Medicare Other | Admitting: Urology

## 2018-07-30 ENCOUNTER — Other Ambulatory Visit: Payer: Self-pay

## 2018-07-30 VITALS — BP 152/82 | HR 81 | Ht 66.0 in | Wt 132.0 lb

## 2018-07-30 DIAGNOSIS — R972 Elevated prostate specific antigen [PSA]: Secondary | ICD-10-CM

## 2018-07-30 DIAGNOSIS — N400 Enlarged prostate without lower urinary tract symptoms: Secondary | ICD-10-CM

## 2018-07-30 MED ORDER — DUTASTERIDE 0.5 MG PO CAPS: 0.5000 mg | ORAL_CAPSULE | Freq: Every day | ORAL | 3 refills | Status: DC

## 2018-07-30 NOTE — Progress Notes (Signed)
07/30/2018 10:18 AM   Tyler Mart. 09/06/1945 [id number]  Referring provider: Sherrin Daisy, MD No address on file  Chief Complaint  Patient presents with  . Elevated PSA    Urologic history: 1. Elevated PSA  - biopsies 2001 and 2002 for PSAs of 4.2 and 7.8 respectively with benign pathology.  Prostate MRI 05/2015 showed no suspicious lesions  2. BPH with lower urinary tract symptoms   -started dutasteride 8963  HPI: 73 year old male presents for annual follow-up.  He has had no problems since his visit last year.  IPSS completed today was 1/0.  Denies dysuria or gross hematuria.  Denies flank, abdominal, pelvic or scrotal pain.  Uncorrected PSA performed June 2019 was 2.7.  He remains on dutasteride.   PMH: Past Medical History:  Diagnosis Date  . Arthritis   . Headache   . Hearing aid worn    bilateral  . History of DVT (deep vein thrombosis)   . History of kidney stones    h/o    Surgical History: Past Surgical History:  Procedure Laterality Date  . COLONOSCOPY    . COLONOSCOPY WITH PROPOFOL N/A 03/27/2016   Procedure: COLONOSCOPY WITH PROPOFOL;  Surgeon: Lucilla Lame, MD;  Location: [place];  Service: Endoscopy;  Laterality: N/A;  . FOOT SURGERY Right   . HERNIA REPAIR Right    inguinal  . INGUINAL HERNIA REPAIR Right 02/23/2018   Procedure: LAPAROSCOPIC INGUINAL HERNIA RECURRENT;  Surgeon: Benjamine Sprague, DO;  Location: [facility];  Service: General;  Laterality: Right;  . KNEE SURGERY Right   . POLYPECTOMY  03/27/2016   Procedure: POLYPECTOMY;  Surgeon: Lucilla Lame, MD;  Location: [place];  Service: Endoscopy;;  . REVERSE SHOULDER ARTHROPLASTY Right 10/06/2017   Procedure: REVERSE SHOULDER ARTHROPLASTY;  Surgeon: Corky Mull, MD;  Location: [facility];  Service: Orthopedics;  Laterality: Right;  . SURGERY OF LIP      Home Medications:  Allergies as of 07/30/2018      Reactions   Sulfa Antibiotics Rash      Medication List        Accurate as of July 30, 2018 10:18 AM. If you have any questions, ask your nurse or doctor.        aspirin-acetaminophen-caffeine 209-470-96 MG tablet Commonly known as: EXCEDRIN MIGRAINE Take by mouth.   CALCIUM 600+D3 PO Take 1 tablet by mouth daily.   dutasteride 0.5 MG capsule Commonly known as: AVODART Take 1 capsule (0.5 mg total) by mouth daily. What changed: when to take this   ibuprofen 800 MG tablet Commonly known as: ADVIL Take 1 tablet (800 mg total) by mouth every 8 (eight) hours as needed for mild pain or moderate pain.   multivitamin with minerals Tabs tablet Take 1 tablet by mouth daily. Centrum Silver   Vitamin B-12 5000 MCG Tbdp Take 5,000 mcg by mouth daily.   Vitamin D3 50 MCG (2000 UT) Tabs Take 2,000 Units by mouth daily.       Allergies:  Allergies  Allergen Reactions  . Sulfa Antibiotics Rash    Family History: Family History  Problem Relation Age of Onset  . Parkinson's disease Father     Social History:  reports that he has never smoked. He has never used smokeless tobacco. He reports previous drug use. He reports that he does not drink alcohol.  ROS: UROLOGY Frequent Urination?: No Hard to postpone urination?: No Burning/pain with urination?: No Get up at night to urinate?: No Leakage  of urine?: No Urine stream starts and stops?: Yes Trouble starting stream?: Yes Do you have to strain to urinate?: No Blood in urine?: No Urinary tract infection?: No Sexually transmitted disease?: No Injury to kidneys or bladder?: No Painful intercourse?: No Weak stream?: No Erection problems?: No Penile pain?: No  Gastrointestinal Nausea?: No Vomiting?: No Indigestion/heartburn?: No Diarrhea?: No Constipation?: No  Constitutional Fever: No Night sweats?: No Weight loss?: No Fatigue?: No  Skin Skin rash/lesions?: No Itching?: No  Eyes Blurred vision?: No Double vision?: No  Ears/Nose/Throat Sore throat?: No Sinus  problems?: No  Hematologic/Lymphatic Swollen glands?: No Easy bruising?: No  Cardiovascular Leg swelling?: No Chest pain?: No  Respiratory Cough?: No Shortness of breath?: No  Endocrine Excessive thirst?: No  Musculoskeletal Back pain?: No Joint pain?: No  Neurological Headaches?: No Dizziness?: No  Psychologic Depression?: No Anxiety?: No  Physical Exam: BP (!) 152/82 (BP Location: Left Arm, Patient Position: Sitting, Cuff Size: Normal)   Pulse 81   Ht 5\' 6"  (1.676 m)   Wt 132 lb (59.9 kg)   BMI 21.31 kg/m   Constitutional:  Alert and oriented, No acute distress. HEENT: [place] AT, moist mucus membranes.  Trachea midline, no masses. Cardiovascular: No clubbing, cyanosis, or edema. Respiratory: Normal respiratory effort, no increased work of breathing. GI: Abdomen is soft, nontender, nondistended, no abdominal masses GU: No CVA tenderness.  Prostate 60 g, smooth without nodules Lymph: No cervical or inguinal lymphadenopathy. Skin: No rashes, bruises or suspicious lesions. Neurologic: Grossly intact, no focal deficits, moving all 4 extremities. Psychiatric: Normal mood and affect.   Assessment & Plan:   73 year old male with BPH and mild PSA elevation with previous benign prostate biopsy.  He desires to continue annual visits/PSA testing for now.  Dutasteride was refilled.  Tyler Woods, [place] [address], [place], [place] [address] [phone]

## 2018-07-30 NOTE — Patient Instructions (Signed)

## 2018-07-31 LAB — PSA: Prostate Specific Ag, Serum: 2.3 ng/mL (ref 0.0–4.0)

## 2018-08-03 ENCOUNTER — Telehealth: Payer: Self-pay

## 2018-08-03 NOTE — Telephone Encounter (Signed)
-----   Message from Abbie Sons, MD sent at 08/02/2018  9:30 PM EDT ----- PSA stable at 2.3

## 2018-08-03 NOTE — Telephone Encounter (Signed)
Patient notified

## 2018-10-18 ENCOUNTER — Other Ambulatory Visit: Payer: Self-pay | Admitting: Surgery

## 2018-10-18 DIAGNOSIS — M7582 Other shoulder lesions, left shoulder: Secondary | ICD-10-CM

## 2018-10-18 DIAGNOSIS — M75122 Complete rotator cuff tear or rupture of left shoulder, not specified as traumatic: Secondary | ICD-10-CM

## 2018-10-28 ENCOUNTER — Other Ambulatory Visit: Payer: Self-pay

## 2018-10-28 ENCOUNTER — Ambulatory Visit
Admission: RE | Admit: 2018-10-28 | Discharge: 2018-10-28 | Disposition: A | Discharge status: Home / Self Care | Payer: Medicare Other | Source: Ambulatory Visit | Attending: Surgery | Admitting: Surgery

## 2018-10-28 DIAGNOSIS — M7582 Other shoulder lesions, left shoulder: Secondary | ICD-10-CM | POA: Diagnosis present

## 2018-10-28 DIAGNOSIS — M75122 Complete rotator cuff tear or rupture of left shoulder, not specified as traumatic: Secondary | ICD-10-CM | POA: Diagnosis present

## 2018-11-16 ENCOUNTER — Other Ambulatory Visit: Payer: Self-pay | Admitting: Surgery

## 2018-11-19 ENCOUNTER — Other Ambulatory Visit: Payer: Self-pay

## 2018-11-19 ENCOUNTER — Other Ambulatory Visit: Payer: Medicare Other

## 2018-11-19 ENCOUNTER — Encounter
Admission: RE | Admit: 2018-11-19 | Discharge: 2018-11-19 | Disposition: A | Discharge status: Home / Self Care | Payer: Medicare Other | Source: Ambulatory Visit | Attending: Surgery | Admitting: Surgery

## 2018-11-19 DIAGNOSIS — Z20828 Contact with and (suspected) exposure to other viral communicable diseases: Secondary | ICD-10-CM | POA: Insufficient documentation

## 2018-11-19 DIAGNOSIS — R001 Bradycardia, unspecified: Secondary | ICD-10-CM | POA: Diagnosis not present

## 2018-11-19 DIAGNOSIS — Z01818 Encounter for other preprocedural examination: Secondary | ICD-10-CM | POA: Diagnosis not present

## 2018-11-19 LAB — SARS CORONAVIRUS 2 (TAT 6-24 HRS): SARS Coronavirus 2: NEGATIVE

## 2018-11-19 NOTE — Patient Instructions (Signed)
Your procedure is scheduled on: Tues 11/10 Report to Day Surgery. To find out your arrival time please call [phone] between 1PM - 3PM on Mon 11/9.  Remember: Instructions that are not followed completely may result in serious medical risk,  up to and including death, or upon the discretion of your surgeon and anesthesiologist your  surgery may need to be rescheduled.     _X__ 1. Do not eat food after midnight the night before your procedure.                 No gum chewing or hard candies. You may drink clear liquids up to 2 hours                 before you are scheduled to arrive for your surgery- DO not drink clear                 liquids within 2 hours of the start of your surgery.                 Clear Liquids include:  water, apple juice without pulp, complete the clear carbohydrate                 Drink 2 hours before you arrive                 Gatorade, Black Coffee or Tea (Do not put cream or milk in coffee or tea).  __X__2.  On the morning of surgery brush your teeth with toothpaste and water, you                may rinse your mouth with mouthwash if you wish.  Do not swallow any toothpaste of mouthwash.     ___ 3.  No Alcohol for 24 hours before or after surgery.   ___ 4.  Do Not Smoke or use e-cigarettes For 24 Hours Prior to Your Surgery.                 Do not use any chewable tobacco products for at least 6 hours prior to                 surgery.  ____  5.  Bring all medications with you on the day of surgery if instructed.   __x__  6.  Notify your doctor if there is any change in your medical condition      (cold, fever, infections).     Do not wear jewelry, make-up, hairpins, clips or nail polish. Do not wear lotions, powders, or perfumes. You may wear deodorant. Do not shave 48 hours prior to surgery. Men may shave face and neck. Do not bring valuables to the hospital.    [facility] is not responsible for any belongings or  valuables.  Contacts, dentures or bridgework may not be worn into surgery. Leave your suitcase in the car. After surgery it may be brought to your room. For patients admitted to the hospital, discharge time is determined by your treatment team.   Patients discharged the day of surgery will not be allowed to drive home.   Please read over the following fact sheets that you were given:     ____ Take these medicines the morning of surgery with A SIP OF WATER:    1. None  2.   3.   4.  5.  6.  ____ Fleet Enema (as directed)   __x__ Use CHG Soap as directed  ____  Use inhalers on the day of surgery  ____ Stop metformin 2 days prior to surgery    ____ Take 1/2 of usual insulin dose the night before surgery. No insulin the morning          of surgery.   ____ Stop Coumadin/Plavix/aspirin on   __x__ Stop Anti-inflammatories no ibuprofen ir aleve   May take tylenol   ____ Stop supplements until after surgery.    ____ Bring C-Pap to the hospital.

## 2018-11-23 ENCOUNTER — Other Ambulatory Visit: Payer: Self-pay

## 2018-11-23 ENCOUNTER — Ambulatory Visit: Payer: Medicare Other | Admitting: Registered Nurse

## 2018-11-23 ENCOUNTER — Ambulatory Visit: Payer: Medicare Other

## 2018-11-23 ENCOUNTER — Encounter: Payer: Self-pay | Admitting: *Deleted

## 2018-11-23 ENCOUNTER — Encounter
Admission: RE | Disposition: A | Discharge status: Home / Self Care | Payer: Self-pay | Source: Home / Self Care | Attending: Surgery

## 2018-11-23 ENCOUNTER — Ambulatory Visit
Admission: RE | Admit: 2018-11-23 | Discharge: 2018-11-23 | Disposition: A | Discharge status: Home / Self Care | Payer: Medicare Other | Attending: Surgery | Admitting: Surgery

## 2018-11-23 DIAGNOSIS — Z8262 Family history of osteoporosis: Secondary | ICD-10-CM | POA: Insufficient documentation

## 2018-11-23 DIAGNOSIS — Z833 Family history of diabetes mellitus: Secondary | ICD-10-CM | POA: Insufficient documentation

## 2018-11-23 DIAGNOSIS — Z882 Allergy status to sulfonamides status: Secondary | ICD-10-CM | POA: Insufficient documentation

## 2018-11-23 DIAGNOSIS — M1990 Unspecified osteoarthritis, unspecified site: Secondary | ICD-10-CM | POA: Insufficient documentation

## 2018-11-23 DIAGNOSIS — Z86718 Personal history of other venous thrombosis and embolism: Secondary | ICD-10-CM | POA: Insufficient documentation

## 2018-11-23 DIAGNOSIS — Z7982 Long term (current) use of aspirin: Secondary | ICD-10-CM | POA: Diagnosis not present

## 2018-11-23 DIAGNOSIS — G43909 Migraine, unspecified, not intractable, without status migrainosus: Secondary | ICD-10-CM | POA: Diagnosis not present

## 2018-11-23 DIAGNOSIS — Z87442 Personal history of urinary calculi: Secondary | ICD-10-CM | POA: Diagnosis not present

## 2018-11-23 DIAGNOSIS — Z820 Family history of epilepsy and other diseases of the nervous system: Secondary | ICD-10-CM | POA: Diagnosis not present

## 2018-11-23 DIAGNOSIS — Z79899 Other long term (current) drug therapy: Secondary | ICD-10-CM | POA: Insufficient documentation

## 2018-11-23 DIAGNOSIS — Z8249 Family history of ischemic heart disease and other diseases of the circulatory system: Secondary | ICD-10-CM | POA: Insufficient documentation

## 2018-11-23 DIAGNOSIS — Z823 Family history of stroke: Secondary | ICD-10-CM | POA: Diagnosis not present

## 2018-11-23 DIAGNOSIS — M7542 Impingement syndrome of left shoulder: Secondary | ICD-10-CM | POA: Diagnosis not present

## 2018-11-23 DIAGNOSIS — M75122 Complete rotator cuff tear or rupture of left shoulder, not specified as traumatic: Secondary | ICD-10-CM | POA: Diagnosis not present

## 2018-11-23 HISTORY — PX: SHOULDER ARTHROSCOPY WITH OPEN ROTATOR CUFF REPAIR: SHX6092

## 2018-11-23 SURGERY — ARTHROSCOPY, SHOULDER WITH REPAIR, ROTATOR CUFF, OPEN
Anesthesia: General | Site: Shoulder | Laterality: Left

## 2018-11-23 MED ORDER — CHLORHEXIDINE GLUCONATE 4 % EX LIQD: 60.0000 mL | Freq: Once | CUTANEOUS | Status: DC

## 2018-11-23 MED ORDER — ONDANSETRON HCL 4 MG/2ML IJ SOLN: 4.0000 mg | Freq: Once | INTRAMUSCULAR | Status: DC | PRN

## 2018-11-23 MED ORDER — PROPOFOL 10 MG/ML IV BOLUS
INTRAVENOUS | Status: AC
  Filled 2018-11-23: qty 20

## 2018-11-23 MED ORDER — PHENYLEPHRINE HCL (PRESSORS) 10 MG/ML IV SOLN
INTRAVENOUS | Status: DC | PRN
  Administered 2018-11-23: 100 ug via INTRAVENOUS
  Administered 2018-11-23: 150 ug via INTRAVENOUS

## 2018-11-23 MED ORDER — CEFAZOLIN SODIUM-DEXTROSE 2-4 GM/100ML-% IV SOLN
INTRAVENOUS | Status: AC
  Filled 2018-11-23: qty 100

## 2018-11-23 MED ORDER — POTASSIUM CHLORIDE IN NACL 20-0.9 MEQ/L-% IV SOLN: INTRAVENOUS | Status: DC

## 2018-11-23 MED ORDER — OXYCODONE HCL 5 MG PO TABS: 5.0000 mg | ORAL_TABLET | ORAL | 0 refills | Status: DC | PRN

## 2018-11-23 MED ORDER — GLYCOPYRROLATE 0.2 MG/ML IJ SOLN
INTRAMUSCULAR | Status: DC | PRN
  Administered 2018-11-23: 0.2 mg via INTRAVENOUS

## 2018-11-23 MED ORDER — SUCCINYLCHOLINE CHLORIDE 20 MG/ML IJ SOLN
INTRAMUSCULAR | Status: AC
  Filled 2018-11-23: qty 1

## 2018-11-23 MED ORDER — METOCLOPRAMIDE HCL 10 MG PO TABS: 5.0000 mg | ORAL_TABLET | Freq: Three times a day (TID) | ORAL | Status: DC | PRN

## 2018-11-23 MED ORDER — BUPIVACAINE-EPINEPHRINE 0.5% -1:200000 IJ SOLN
INTRAMUSCULAR | Status: DC | PRN
  Administered 2018-11-23: 30 mL

## 2018-11-23 MED ORDER — DEXMEDETOMIDINE HCL 200 MCG/2ML IV SOLN
INTRAVENOUS | Status: DC | PRN
  Administered 2018-11-23: 8 ug via INTRAVENOUS
  Administered 2018-11-23: 4 ug via INTRAVENOUS
  Administered 2018-11-23: 8 ug via INTRAVENOUS

## 2018-11-23 MED ORDER — LIDOCAINE HCL (PF) 2 % IJ SOLN
INTRAMUSCULAR | Status: AC
  Filled 2018-11-23: qty 10

## 2018-11-23 MED ORDER — ONDANSETRON HCL 4 MG/2ML IJ SOLN
INTRAMUSCULAR | Status: AC
  Filled 2018-11-23: qty 2

## 2018-11-23 MED ORDER — FENTANYL CITRATE (PF) 100 MCG/2ML IJ SOLN
INTRAMUSCULAR | Status: AC
  Filled 2018-11-23: qty 2

## 2018-11-23 MED ORDER — SUGAMMADEX SODIUM 200 MG/2ML IV SOLN
INTRAVENOUS | Status: AC
  Filled 2018-11-23: qty 2

## 2018-11-23 MED ORDER — BUPIVACAINE LIPOSOME 1.3 % IJ SUSP
INTRAMUSCULAR | Status: DC | PRN
  Administered 2018-11-23: 20 mL via PERINEURAL

## 2018-11-23 MED ORDER — ROCURONIUM BROMIDE 50 MG/5ML IV SOLN
INTRAVENOUS | Status: AC
  Filled 2018-11-23: qty 1

## 2018-11-23 MED ORDER — PHENYLEPHRINE HCL (PRESSORS) 10 MG/ML IV SOLN
INTRAVENOUS | Status: AC
  Filled 2018-11-23: qty 1

## 2018-11-23 MED ORDER — GLYCOPYRROLATE 0.2 MG/ML IJ SOLN
INTRAMUSCULAR | Status: AC
  Filled 2018-11-23: qty 1

## 2018-11-23 MED ORDER — PROPOFOL 10 MG/ML IV BOLUS
INTRAVENOUS | Status: DC | PRN
  Administered 2018-11-23: 120 mg via INTRAVENOUS

## 2018-11-23 MED ORDER — LIDOCAINE HCL (PF) 1 % IJ SOLN
INTRAMUSCULAR | Status: AC
  Filled 2018-11-23: qty 5

## 2018-11-23 MED ORDER — FAMOTIDINE 20 MG PO TABS
ORAL_TABLET | ORAL | Status: AC
  Administered 2018-11-23: 08:00:00 20 mg via ORAL
  Filled 2018-11-23: qty 1

## 2018-11-23 MED ORDER — LIDOCAINE HCL (CARDIAC) PF 100 MG/5ML IV SOSY
PREFILLED_SYRINGE | INTRAVENOUS | Status: DC | PRN
  Administered 2018-11-23: 60 mg via INTRAVENOUS

## 2018-11-23 MED ORDER — OXYCODONE HCL 5 MG PO TABS: 5.0000 mg | ORAL_TABLET | ORAL | Status: DC | PRN

## 2018-11-23 MED ORDER — FENTANYL CITRATE (PF) 100 MCG/2ML IJ SOLN
INTRAMUSCULAR | Status: AC
  Administered 2018-11-23: 50 ug via INTRAVENOUS
  Filled 2018-11-23: qty 2

## 2018-11-23 MED ORDER — DEXAMETHASONE SODIUM PHOSPHATE 10 MG/ML IJ SOLN
INTRAMUSCULAR | Status: AC
  Filled 2018-11-23: qty 1

## 2018-11-23 MED ORDER — LACTATED RINGERS IV SOLN
INTRAVENOUS | Status: DC | PRN
  Administered 2018-11-23: 10:00:00 1 mL

## 2018-11-23 MED ORDER — CEFAZOLIN SODIUM-DEXTROSE 2-4 GM/100ML-% IV SOLN
2.0000 g | INTRAVENOUS | Status: AC
  Administered 2018-11-23: 2 g via INTRAVENOUS

## 2018-11-23 MED ORDER — ONDANSETRON HCL 4 MG/2ML IJ SOLN
INTRAMUSCULAR | Status: DC | PRN
  Administered 2018-11-23: 4 mg via INTRAVENOUS

## 2018-11-23 MED ORDER — METOCLOPRAMIDE HCL 5 MG/ML IJ SOLN: 5.0000 mg | Freq: Three times a day (TID) | INTRAMUSCULAR | Status: DC | PRN

## 2018-11-23 MED ORDER — FENTANYL CITRATE (PF) 100 MCG/2ML IJ SOLN
50.0000 ug | Freq: Once | INTRAMUSCULAR | Status: AC
  Administered 2018-11-23: 09:00:00 50 ug via INTRAVENOUS

## 2018-11-23 MED ORDER — BUPIVACAINE HCL (PF) 0.5 % IJ SOLN
INTRAMUSCULAR | Status: AC
  Filled 2018-11-23: qty 10

## 2018-11-23 MED ORDER — FENTANYL CITRATE (PF) 100 MCG/2ML IJ SOLN: 25.0000 ug | INTRAMUSCULAR | Status: DC | PRN

## 2018-11-23 MED ORDER — FENTANYL CITRATE (PF) 100 MCG/2ML IJ SOLN
INTRAMUSCULAR | Status: DC | PRN
  Administered 2018-11-23: 50 ug via INTRAVENOUS
  Administered 2018-11-23 (×2): 25 ug via INTRAVENOUS

## 2018-11-23 MED ORDER — LACTATED RINGERS IV SOLN
INTRAVENOUS | Status: DC
  Administered 2018-11-23: 08:00:00 via INTRAVENOUS

## 2018-11-23 MED ORDER — ONDANSETRON HCL 4 MG/2ML IJ SOLN: 4.0000 mg | Freq: Four times a day (QID) | INTRAMUSCULAR | Status: DC | PRN

## 2018-11-23 MED ORDER — BUPIVACAINE LIPOSOME 1.3 % IJ SUSP
INTRAMUSCULAR | Status: AC
  Filled 2018-11-23: qty 20

## 2018-11-23 MED ORDER — MIDAZOLAM HCL 2 MG/2ML IJ SOLN
1.0000 mg | Freq: Once | INTRAMUSCULAR | Status: AC
  Administered 2018-11-23: 09:00:00 1 mg via INTRAVENOUS

## 2018-11-23 MED ORDER — FAMOTIDINE 20 MG PO TABS
20.0000 mg | ORAL_TABLET | Freq: Once | ORAL | Status: AC
  Administered 2018-11-23: 08:00:00 20 mg via ORAL

## 2018-11-23 MED ORDER — SODIUM CHLORIDE 0.9 % IV SOLN
INTRAVENOUS | Status: DC | PRN
  Administered 2018-11-23: 20 ug/min via INTRAVENOUS

## 2018-11-23 MED ORDER — ONDANSETRON HCL 4 MG PO TABS: 4.0000 mg | ORAL_TABLET | Freq: Four times a day (QID) | ORAL | Status: DC | PRN

## 2018-11-23 MED ORDER — BUPIVACAINE HCL (PF) 0.5 % IJ SOLN
INTRAMUSCULAR | Status: AC
  Filled 2018-11-23: qty 30

## 2018-11-23 MED ORDER — LIDOCAINE HCL (PF) 1 % IJ SOLN
INTRAMUSCULAR | Status: DC | PRN
  Administered 2018-11-23: 3 mL

## 2018-11-23 MED ORDER — MIDAZOLAM HCL 2 MG/2ML IJ SOLN
INTRAMUSCULAR | Status: AC
  Administered 2018-11-23: 09:00:00 1 mg via INTRAVENOUS
  Filled 2018-11-23: qty 2

## 2018-11-23 MED ORDER — EPINEPHRINE PF 1 MG/ML IJ SOLN
INTRAMUSCULAR | Status: AC
  Filled 2018-11-23: qty 3

## 2018-11-23 MED ORDER — DEXAMETHASONE SODIUM PHOSPHATE 10 MG/ML IJ SOLN
INTRAMUSCULAR | Status: DC | PRN
  Administered 2018-11-23: 5 mg via INTRAVENOUS

## 2018-11-23 MED ORDER — SUCCINYLCHOLINE CHLORIDE 20 MG/ML IJ SOLN
INTRAMUSCULAR | Status: DC | PRN
  Administered 2018-11-23: 80 mg via INTRAVENOUS

## 2018-11-23 MED ORDER — BUPIVACAINE HCL (PF) 0.5 % IJ SOLN
INTRAMUSCULAR | Status: DC | PRN
  Administered 2018-11-23: 10 mL via PERINEURAL

## 2018-11-23 SURGICAL SUPPLY — 50 items
ANCHOR HEALICOIL REGEN 5.5 (Anchor) ×4 IMPLANT
ANCHOR JUGGERKNOT WTAP NDL 2.9 (Anchor) ×4 IMPLANT
BIT DRILL JUGRKNT W/NDL BIT2.9 (DRILL) IMPLANT
BLADE FULL RADIUS 3.5 (BLADE) ×3 IMPLANT
BUR ACROMIONIZER 4.0 (BURR) ×3 IMPLANT
CANNULA SHAVER 8MMX76MM (CANNULA) ×3 IMPLANT
CHLORAPREP W/TINT 26 (MISCELLANEOUS) ×3 IMPLANT
COVER MAYO STAND REUSABLE (DRAPES) ×3 IMPLANT
COVER WAND RF STERILE (DRAPES) ×3 IMPLANT
DILATOR 5.5 THREADED HEALICOIL (MISCELLANEOUS) ×2 IMPLANT
DRAPE IMP U-DRAPE 54X76 (DRAPES) ×6 IMPLANT
DRAPE SPLIT 6X30 W/TAPE (DRAPES) ×2 IMPLANT
DRILL JUGGERKNOT W/NDL BIT 2.9 (DRILL) ×3
ELECT CAUTERY BLADE TIP 2.5 (TIP) ×3
ELECT REM PT RETURN 9FT ADLT (ELECTROSURGICAL) ×3
ELECTRODE CAUTERY BLDE TIP 2.5 (TIP) ×1 IMPLANT
ELECTRODE REM PT RTRN 9FT ADLT (ELECTROSURGICAL) ×1 IMPLANT
GAUZE SPONGE 4X4 12PLY STRL (GAUZE/BANDAGES/DRESSINGS) ×3 IMPLANT
GAUZE XEROFORM 1X8 LF (GAUZE/BANDAGES/DRESSINGS) ×3 IMPLANT
GLOVE BIO SURGEON STRL SZ7.5 (GLOVE) ×6 IMPLANT
GLOVE BIO SURGEON STRL SZ8 (GLOVE) ×6 IMPLANT
GLOVE BIOGEL PI IND STRL 8 (GLOVE) ×1 IMPLANT
GLOVE BIOGEL PI INDICATOR 8 (GLOVE) ×2
GLOVE INDICATOR 8.0 STRL GRN (GLOVE) ×3 IMPLANT
GOWN STRL REUS W/ TWL LRG LVL3 (GOWN DISPOSABLE) ×1 IMPLANT
GOWN STRL REUS W/ TWL XL LVL3 (GOWN DISPOSABLE) ×1 IMPLANT
GOWN STRL REUS W/TWL LRG LVL3 (GOWN DISPOSABLE) ×6
GOWN STRL REUS W/TWL XL LVL3 (GOWN DISPOSABLE) ×2
GRASPER SUT 15 45D LOW PRO (SUTURE) IMPLANT
IV LACTATED RINGER IRRG 3000ML (IV SOLUTION) ×4
IV LR IRRIG 3000ML ARTHROMATIC (IV SOLUTION) ×2 IMPLANT
MANIFOLD NEPTUNE II (INSTRUMENTS) ×3 IMPLANT
MASK FACE SPIDER DISP (MASK) ×3 IMPLANT
MAT ABSORB  FLUID 56X50 GRAY (MISCELLANEOUS) ×2
MAT ABSORB FLUID 56X50 GRAY (MISCELLANEOUS) ×1 IMPLANT
PACK ARTHROSCOPY SHOULDER (MISCELLANEOUS) ×3 IMPLANT
PASSER SUT FIRSTPASS SELF (INSTRUMENTS) ×2 IMPLANT
SLING ARM LRG DEEP (SOFTGOODS) ×1 IMPLANT
SLING ULTRA II LG (MISCELLANEOUS) ×3 IMPLANT
STAPLER SKIN PROX 35W (STAPLE) ×3 IMPLANT
STRAP SAFETY 5IN WIDE (MISCELLANEOUS) ×3 IMPLANT
SUT ETHIBOND 0 MO6 C/R (SUTURE) ×3 IMPLANT
SUT ULTRABRAID 2 COBRAID 38 (SUTURE) ×4 IMPLANT
SUT VIC AB 2-0 CT1 27 (SUTURE) ×4
SUT VIC AB 2-0 CT1 TAPERPNT 27 (SUTURE) ×2 IMPLANT
TAPE MICROFOAM 4IN (TAPE) ×3 IMPLANT
TUBING ARTHRO INFLOW-ONLY STRL (TUBING) ×3 IMPLANT
TUBING CONNECTING 10 (TUBING) ×2 IMPLANT
TUBING CONNECTING 10' (TUBING) ×1
WAND WEREWOLF FLOW 90D (MISCELLANEOUS) ×3 IMPLANT

## 2018-11-23 NOTE — H&P (Signed)
Paper H&P to be scanned into permanent record. H&P reviewed and patient re-examined. No changes. 

## 2018-11-23 NOTE — Discharge Instructions (Addendum)
Interscalene Nerve Block with Exparel  1.  For your surgery you have received an Interscalene Nerve Block with Exparel. 2. Nerve Blocks affect many types of nerves, including nerves that control movement, pain and normal sensation.  You may experience feelings such as numbness, tingling, heaviness, weakness or the inability to move your arm or the feeling or sensation that your arm has "fallen asleep". 3. A nerve block with Exparel can last up to 5 days.  Usually the weakness wears off first.  The tingling and heaviness usually wear off next.  Finally you may start to notice pain.  Keep in mind that this may occur in any order.  Once a nerve block starts to wear off it is usually completely gone within 60 minutes. 4. ISNB may cause mild shortness of breath, a hoarse voice, blurry vision, unequal pupils, or drooping of the face on the same side as the nerve block.  These symptoms will usually resolve with the numbness.  Very rarely the procedure itself can cause mild seizures. 5. If needed, your surgeon will give you a prescription for pain medication.  It will take about 60 minutes for the oral pain medication to become fully effective.  So, it is recommended that you start taking this medication before the nerve block first begins to wear off, or when you first begin to feel discomfort. 6. Take your pain medication only as prescribed.  Pain medication can cause sedation and decrease your breathing if you take more than you need for the level of pain that you have. 7. Nausea is a common side effect of many pain medications.  You may want to eat something before taking your pain medicine to prevent nausea. 8. After an Interscalene nerve block, you cannot feel pain, pressure or extremes in temperature in the effected arm.  Because your arm is numb it is at an increased risk for injury.  To decrease the possibility of injury, please practice the following:  a. While you are awake change the position of  your arm frequently to prevent too much pressure on any one area for prolonged periods of time. b.  If you have a cast or tight dressing, check the color or your fingers every couple of hours.  Call your surgeon with the appearance of any discoloration (white or blue). c. If you are given a sling to wear before you go home, please wear it  at all times until the block has completely worn off.  Do not get up at night without your sling. d. Please contact [facility] or your surgeon if you do not begin to regain sensation after 7 days from the surgery.  Anesthesia may be contacted by calling the Same Day Surgery Department, Mon. through Fri., 6 am to 4 pm at [phone].   e. If you experience any other problems or concerns, please contact your surgeon's office. If you experience severe or prolonged shortness of breath go to the nearest emergency department.      AMBULATORY SURGERY  DISCHARGE INSTRUCTIONS   1) The drugs that you were given will stay in your system until tomorrow so for the next 24 hours you should not:  A) Drive an automobile B) Make any legal decisions C) Drink any alcoholic beverage   2) You may resume regular meals tomorrow.  Today it is better to start with liquids and gradually work up to solid foods.  You may eat anything you prefer, but it is better to start with liquids,  then soup and crackers, and gradually work up to solid foods.   3) Please notify your doctor immediately if you have any unusual bleeding, trouble breathing, redness and pain at the surgery site, drainage, fever, or pain not relieved by medication. 4)   5) Your post-operative visit with Dr.                                     is: Date:                        Time:    Please call to schedule your post-operative visit.  6) Additional Instructions:     Orthopedic discharge instructions: Keep dressing dry and intact.  May sponge-bathe after dressing removed on post-op day #4  (Saturday).  Cover staples with Band-Aids after drying off. Apply ice frequently to shoulder. Take ibuprofen 600-800 mg TID with meals for 7-10 days, then as necessary. Take oxycodone as prescribed when needed.  May supplement with ES Tylenol if necessary. Keep shoulder immobilizer on at all times except may remove for bathing purposes. Follow-up in 10-14 days or as scheduled.

## 2018-11-23 NOTE — Anesthesia Postprocedure Evaluation (Signed)
Anesthesia Post Note  Patient: Tyler Woods.  Procedure(s) Performed: SHOULDER ARTHROSCOPY WITH DEBRIDEMENT, DECOMPRESSION, AND ROTATOR CUFF REPAIR. (Left Shoulder)  Patient location during evaluation: PACU Anesthesia Type: General Level of consciousness: awake and alert and oriented Pain management: pain level controlled Vital Signs Assessment: post-procedure vital signs reviewed and stable Respiratory status: spontaneous breathing, nonlabored ventilation and respiratory function stable Cardiovascular status: blood pressure returned to baseline and stable Postop Assessment: no signs of nausea or vomiting Anesthetic complications: no     Last Vitals:  Vitals:   11/23/18 1242 11/23/18 1305  BP: 121/79 126/82  Pulse: 67 60  Resp: 13 14  Temp:  (!) 36.2 C  SpO2: 96% 97%    Last Pain:  Vitals:   11/23/18 1305  TempSrc: Temporal  PainSc: 0-No pain                 Viveca Beckstrom

## 2018-11-23 NOTE — Anesthesia Preprocedure Evaluation (Signed)
Anesthesia Evaluation  Patient identified by MRN, date of birth, ID band Patient awake    Reviewed: Allergy & Precautions, NPO status , Patient's Chart, lab work & pertinent test results  History of Anesthesia Complications Negative for: history of anesthetic complications  Airway Mallampati: II  TM Distance: >3 FB Neck ROM: Full    Dental no notable dental hx.    Pulmonary neg pulmonary ROS, neg sleep apnea, neg COPD,    breath sounds clear to auscultation- rhonchi (-) wheezing      Cardiovascular Exercise Tolerance: Good (-) hypertension(-) CAD, (-) Past MI, (-) Cardiac Stents and (-) CABG  Rhythm:Regular Rate:Normal - Systolic murmurs and - Diastolic murmurs    Neuro/Psych  Headaches, neg Seizures negative psych ROS   GI/Hepatic negative GI ROS, Neg liver ROS,   Endo/Other  negative endocrine ROSneg diabetes  Renal/GU negative Renal ROS     Musculoskeletal  (+) Arthritis ,   Abdominal (+) - obese,   Peds  Hematology negative hematology ROS (+)   Anesthesia Other Findings Past Medical History: No date: Arthritis No date: Headache No date: Hearing aid worn     Comment:  bilateral No date: History of DVT (deep vein thrombosis) No date: History of kidney stones     Comment:  h/o   Reproductive/Obstetrics                             Anesthesia Physical Anesthesia Plan  ASA: II  Anesthesia Plan: General   Post-op Pain Management:  Regional for Post-op pain   Induction: Intravenous  PONV Risk Score and Plan: 1 and Ondansetron, Dexamethasone and Midazolam  Airway Management Planned: Oral ETT  Additional Equipment:   Intra-op Plan:   Post-operative Plan: Extubation in OR  Informed Consent: I have reviewed the patients History and Physical, chart, labs and discussed the procedure including the risks, benefits and alternatives for the proposed anesthesia with the patient or  authorized representative who has indicated his/her understanding and acceptance.     Dental advisory given  Plan Discussed with: CRNA and Anesthesiologist  Anesthesia Plan Comments:         Anesthesia Quick Evaluation

## 2018-11-23 NOTE — Transfer of Care (Signed)
Immediate Anesthesia Transfer of Care Note  Patient: Tyler Woods.  Procedure(s) Performed: SHOULDER ARTHROSCOPY WITH DEBRIDEMENT, DECOMPRESSION, AND ROTATOR CUFF REPAIR. (Left Shoulder)  Patient Location: PACU  Anesthesia Type:General  Level of Consciousness: drowsy and patient cooperative  Airway & Oxygen Therapy: Patient Spontanous Breathing and Patient connected to face mask oxygen  Post-op Assessment: Report given to RN and Post -op Vital signs reviewed and stable  Post vital signs: Reviewed and stable  Last Vitals:  Vitals Value Taken Time  BP 114/76 11/23/18 1200  Temp 36.5 C 11/23/18 1200  Pulse 73 11/23/18 1200  Resp 14 11/23/18 1200  SpO2 99 % 11/23/18 1200    Last Pain:  Vitals:   11/23/18 1200  TempSrc: Temporal  PainSc:          Complications: No apparent anesthesia complications

## 2018-11-23 NOTE — Anesthesia Post-op Follow-up Note (Signed)
Anesthesia QCDR form completed.        

## 2018-11-23 NOTE — Anesthesia Procedure Notes (Signed)
Procedure Name: Intubation Date/Time: 11/23/2018 9:49 AM Performed by: Leeroy Cha, CRNA Pre-anesthesia Checklist: Patient identified, Emergency Drugs available, Suction available and Patient being monitored Patient Re-evaluated:Patient Re-evaluated prior to induction Oxygen Delivery Method: Circle system utilized Preoxygenation: Pre-oxygenation with 100% oxygen Induction Type: IV induction Ventilation: Mask ventilation without difficulty Laryngoscope Size: Mac and 4 Grade View: Grade I Tube type: Oral Tube size: 7.5 mm Number of attempts: 1 Airway Equipment and Method: Stylet and Oral airway Placement Confirmation: ETT inserted through vocal cords under direct vision,  positive ETCO2 and breath sounds checked- equal and bilateral Secured at: 23 cm Tube secured with: Tape Dental Injury: Teeth and Oropharynx as per pre-operative assessment

## 2018-11-23 NOTE — Anesthesia Procedure Notes (Signed)
Anesthesia Regional Block: Adductor canal block   Pre-Anesthetic Checklist: ,, timeout performed, Correct Patient, Correct Site, Correct Laterality, Correct Procedure, Correct Position, site marked, Risks and benefits discussed,  Surgical consent,  Pre-op evaluation,  At surgeon's request and post-op pain management  Laterality: Left  Prep: chloraprep       Needles:  Injection technique: Single-shot  Needle Type: Stimiplex     Needle Length: 10cm  Needle Gauge: 21     Additional Needles:   Procedures:,,,, ultrasound used (permanent image in chart),,,,  Narrative:  Start time: 11/23/2018 9:00 AM End time: 11/23/2018 9:05 AM Injection made incrementally with aspirations every 5 mL.  Performed by: Personally  Anesthesiologist: Emmie Niemann, MD  Additional Notes: Functioning IV was confirmed and monitors were applied.  A Stimuplex needle was used. Sterile prep and drape,hand hygiene and sterile gloves were used.  Negative aspiration and negative test dose prior to incremental administration of local anesthetic. The patient tolerated the procedure well.

## 2018-11-23 NOTE — Op Note (Signed)
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  Post-Op Diagnosis:   Same  Procedure:   Limited arthroscopic debridement, arthroscopic subacromial decompression, and mini-open rotator cuff repair, left shoulder.  Anesthesia:   General endotracheal with interscalene block using Exparel placed preoperatively by the anesthesiologist.  Surgeon:   Pascal Lux, MD  Assistant:   Cameron Proud, PA-C; Larrie Kass, PA-S  Findings:   As above. There was a large rotator cuff tear involving the entire supraspinatus tendon and anterior fibers of the infraspinatus tendon with tearing approximately 1 cm medial to the lateral-most insertion of the tendon.  The remainder of the rotator cuff was in satisfactory condition. There was small areas of labral fraying without frank detachment from the glenoid. Grade 3 chondromalacial changes were noted involving the central portion of the glenoid. The humeral articular surface appeared to be in satisfactory condition. The biceps tendon had chronically torn and retracted distally.  Complications:   None  Fluids:   800 cc  Estimated blood loss:   10 cc  Tourniquet time:   None  Drains:   None  Closure:   Staples      Brief clinical note:   The patient is a 73 year old male with a long history of left shoulder pain which has worsened recently. The patient's symptoms have progressed despite medications, activity modification, etc. The patient's history and examination are consistent with impingement/tendinopathy with a rotator cuff tear. These findings were confirmed by MRI scan. The patient presents at this time for definitive management of these shoulder symptoms.  Procedure:   The patient underwent placement of an interscalene block using Exparel by the anesthesiologist in the preoperative holding area before being brought into the operating  room and lain in the supine position. The patient then underwent general endotracheal intubation and anesthesia before being repositioned in the beach chair position using the beach chair positioner. The left shoulder and upper extremity were prepped with ChloraPrep solution before being draped sterilely. Preoperative antibiotics were administered. A timeout was performed to confirm the proper surgical site before the expected portal sites and incision site were injected with 0.5% Sensorcaine with epinephrine. A posterior portal was created and the glenohumeral joint thoroughly inspected with the findings as described above. An anterior portal was created using an outside-in technique. The labrum and rotator cuff were further probed, again confirming the above-noted findings.  Areas of labral fraying and synovitis were debrided back to stable margins using the full-radius resector. The ArthroCare wand was inserted and used to obtain hemostasis as well as to "anneal" the labrum superiorly and anteriorly. The instruments were removed from the joint after suctioning the excess fluid.  The camera was repositioned through the posterior portal into the subacromial space. A separate lateral portal was created using an outside-in technique. The 3.5 mm full-radius resector was introduced and used to perform a subtotal bursectomy. The ArthroCare wand was then inserted and used to remove the periosteal tissue off the undersurface of the anterior third of the acromion as well as to recess the coracoacromial ligament from its attachment along the anterior and lateral margins of the acromion. The 4.0 mm acromionizing bur was introduced and used to complete the decompression by removing the undersurface of the anterior third of the acromion. The full radius resector was reintroduced to remove any residual bony debris before the ArthroCare wand was reintroduced to obtain hemostasis. The  instruments were then removed from the  subacromial space after suctioning the excess fluid.  An approximately 4-5 cm incision was made over the anterolateral aspect of the shoulder beginning at the anterolateral corner of the acromion and extending distally in line with the bicipital groove. This incision was carried down through the subcutaneous tissues to expose the deltoid fascia. The raphae between the anterior and middle thirds was identified and this plane developed to provide access into the subacromial space. Additional bursal tissues were debrided sharply using Metzenbaum scissors. The rotator cuff tear was readily identified. The margins were debrided sharply with a #15 blade and the exposed greater tuberosity roughened with a rongeur. The tear was repaired using two Biomet 2.9 mm JuggerKnot anchors. These sutures were then brought back laterally and passed through the remaining rotator cuff tissue on the greater tuberosity before being tied again. These sutures were then secured to the lateral edge of the greater tuberosity using two 5.5 mm [employer] Healicoil knotless Regenasorb anchors to create a two-layer closure. An apparent watertight closure was obtained.  The wound was copiously irrigated with sterile saline solution before the deltoid raphae was reapproximated using 2-0 Vicryl interrupted sutures. The subcutaneous tissues were closed in two layers using 2-0 Vicryl interrupted sutures before the skin was closed using staples. The portal sites also were closed using staples. A sterile bulky dressing was applied to the shoulder before the arm was placed into a shoulder immobilizer. The patient was then awakened, extubated, and returned to the recovery room in satisfactory condition after tolerating the procedure well.

## 2018-11-24 ENCOUNTER — Encounter: Payer: Self-pay | Admitting: Surgery

## 2019-01-04 ENCOUNTER — Other Ambulatory Visit: Payer: Self-pay | Admitting: Family Medicine

## 2019-01-04 DIAGNOSIS — R748 Abnormal levels of other serum enzymes: Secondary | ICD-10-CM

## 2019-01-17 ENCOUNTER — Other Ambulatory Visit: Payer: Self-pay

## 2019-01-17 ENCOUNTER — Ambulatory Visit
Admission: RE | Admit: 2019-01-17 | Discharge: 2019-01-17 | Disposition: A | Discharge status: Home / Self Care | Payer: Medicare PPO | Source: Ambulatory Visit | Attending: Family Medicine | Admitting: Family Medicine

## 2019-01-17 DIAGNOSIS — R748 Abnormal levels of other serum enzymes: Secondary | ICD-10-CM

## 2019-02-07 ENCOUNTER — Ambulatory Visit: Payer: Medicare PPO | Attending: Internal Medicine

## 2019-02-07 DIAGNOSIS — Z20822 Contact with and (suspected) exposure to covid-19: Secondary | ICD-10-CM

## 2019-02-08 LAB — NOVEL CORONAVIRUS, NAA: SARS-CoV-2, NAA: NOT DETECTED

## 2019-07-26 ENCOUNTER — Other Ambulatory Visit: Payer: Self-pay

## 2019-07-26 ENCOUNTER — Other Ambulatory Visit: Payer: Medicare PPO

## 2019-07-26 DIAGNOSIS — R972 Elevated prostate specific antigen [PSA]: Secondary | ICD-10-CM

## 2019-07-27 LAB — PSA: Prostate Specific Ag, Serum: 1.9 ng/mL (ref 0.0–4.0)

## 2019-08-02 ENCOUNTER — Ambulatory Visit: Payer: Medicare Other | Admitting: Urology

## 2019-08-03 ENCOUNTER — Encounter: Payer: Self-pay | Admitting: Urology

## 2019-08-03 ENCOUNTER — Ambulatory Visit (INDEPENDENT_AMBULATORY_CARE_PROVIDER_SITE_OTHER): Payer: Medicare PPO | Admitting: Urology

## 2019-08-03 ENCOUNTER — Other Ambulatory Visit: Payer: Self-pay

## 2019-08-03 VITALS — BP 159/73 | HR 98 | Ht 66.0 in | Wt 130.0 lb

## 2019-08-03 DIAGNOSIS — Z87898 Personal history of other specified conditions: Secondary | ICD-10-CM | POA: Diagnosis not present

## 2019-08-03 DIAGNOSIS — N138 Other obstructive and reflux uropathy: Secondary | ICD-10-CM

## 2019-08-03 DIAGNOSIS — N401 Enlarged prostate with lower urinary tract symptoms: Secondary | ICD-10-CM

## 2019-08-03 MED ORDER — DUTASTERIDE 0.5 MG PO CAPS: 0.5000 mg | ORAL_CAPSULE | Freq: Every day | ORAL | 3 refills | Status: DC

## 2019-08-03 NOTE — Progress Notes (Signed)
08/03/2019 11:26 AM   Tyler Woods 1945-01-22 [id number]  Referring provider: Sofie Hartigan, MD [place],  [place] [address]  Chief Complaint  Patient presents with  . Follow-up    Urologic history: 1. Elevated PSA - biopsies 2001 and 2002 for PSAs of 4.2 and 7.8; benign pathology. -Prostate MRI 05/2015 showed no suspicious lesions  2. BPH with lower urinary tract symptoms  -started dutasteride 2008  HPI: 74 y.o. male presents for annual follow-up.   No urologic issues since his visit last year  No bothersome LUTS; occasional hesitancy, mild frequency  Denies dysuria, gross hematuria  Denies flank, abdominal, pelvic pain  Remains on dutasteride  PSA 07/26/2019 stable 1.9 (uncorrected)   PMH: Past Medical History:  Diagnosis Date  . Arthritis   . Headache   . Hearing aid worn    bilateral  . History of DVT (deep vein thrombosis)   . History of kidney stones    h/o    Surgical History: Past Surgical History:  Procedure Laterality Date  . COLONOSCOPY    . COLONOSCOPY WITH PROPOFOL N/A 03/27/2016   Procedure: COLONOSCOPY WITH PROPOFOL;  Surgeon: Lucilla Lame, MD;  Location: [facility];  Service: Endoscopy;  Laterality: N/A;  . FOOT SURGERY Right   . HERNIA REPAIR Right    inguinal  . INGUINAL HERNIA REPAIR Right 02/23/2018   Procedure: LAPAROSCOPIC INGUINAL HERNIA RECURRENT;  Surgeon: Benjamine Sprague, DO;  Location: [facility];  Service: General;  Laterality: Right;  . KNEE SURGERY Right   . POLYPECTOMY  03/27/2016   Procedure: POLYPECTOMY;  Surgeon: Lucilla Lame, MD;  Location: [facility];  Service: Endoscopy;;  . REVERSE SHOULDER ARTHROPLASTY Right 10/06/2017   Procedure: REVERSE SHOULDER ARTHROPLASTY;  Surgeon: Corky Mull, MD;  Location: [facility];  Service: Orthopedics;  Laterality: Right;  . SHOULDER ARTHROSCOPY WITH OPEN ROTATOR CUFF REPAIR Left 11/23/2018   Procedure: SHOULDER ARTHROSCOPY WITH DEBRIDEMENT,  DECOMPRESSION, AND ROTATOR CUFF REPAIR.;  Surgeon: Corky Mull, MD;  Location: [facility];  Service: Orthopedics;  Laterality: Left;  . SURGERY OF LIP      Home Medications:  Allergies as of 08/03/2019      Reactions   Sulfa Antibiotics Rash      Medication List       Accurate as of August 03, 2019 11:26 AM. If you have any questions, ask your nurse or doctor.        STOP taking these medications   oxyCODONE 5 MG immediate release tablet Commonly known as: Roxicodone Stopped by: Abbie Sons, MD     TAKE these medications   CALCIUM 600+D3 PO Take 1 tablet by mouth daily.   dutasteride 0.5 MG capsule Commonly known as: AVODART Take 1 capsule (0.5 mg total) by mouth at bedtime.   multivitamin with minerals Tabs tablet Take 1 tablet by mouth daily. Centrum Silver   Vitamin B-12 5000 MCG Tbdp Take 5,000 mcg by mouth daily.   Vitamin D3 50 MCG (2000 UT) Tabs Take 2,000 Units by mouth daily.       Allergies:  Allergies  Allergen Reactions  . Sulfa Antibiotics Rash    Family History: Family History  Problem Relation Age of Onset  . Parkinson's disease Father     Social History:  reports that he has never smoked. He has never used smokeless tobacco. He reports previous drug use. He reports that he does not drink alcohol.   Physical Exam: BP (!) 159/73  Pulse 98   Ht 5\' 6"  (1.676 m)   Wt 130 lb (59 kg)   BMI 20.98 kg/m   Constitutional:  Alert and oriented, No acute distress. HEENT: [place] AT, moist mucus membranes.  Trachea midline, no masses. Cardiovascular: No clubbing, cyanosis, or edema. Respiratory: Normal respiratory effort, no increased work of breathing. GU: Prostate 60+ grams, smooth without nodules Skin: No rashes, bruises or suspicious lesions. Neurologic: Grossly intact, no focal deficits, moving all 4 extremities. Psychiatric: Normal mood and affect.     Assessment & Plan:    1.  Elevated PSA  We again discussed prostate cancer  screening guidelines with recommendations between the ages of 53-69.  He has requested to continue annual screening  Continue annual follow-up  2.  BPH with LUTS  Mild lower urinary tract symptoms  Dutasteride refilled    Abbie Sons, MD  [facility] [address] [place], [place] [address] [phone]

## 2020-06-18 ENCOUNTER — Other Ambulatory Visit: Payer: Self-pay | Admitting: *Deleted

## 2020-06-18 DIAGNOSIS — R972 Elevated prostate specific antigen [PSA]: Secondary | ICD-10-CM

## 2020-07-23 ENCOUNTER — Other Ambulatory Visit: Payer: Self-pay | Admitting: Urology

## 2020-08-01 ENCOUNTER — Other Ambulatory Visit: Payer: Medicare PPO

## 2020-08-01 ENCOUNTER — Other Ambulatory Visit: Payer: Self-pay

## 2020-08-01 DIAGNOSIS — R972 Elevated prostate specific antigen [PSA]: Secondary | ICD-10-CM

## 2020-08-02 LAB — PSA: Prostate Specific Ag, Serum: 2.2 ng/mL (ref 0.0–4.0)

## 2020-08-05 NOTE — Progress Notes (Signed)
08/06/2020 9:24 AM   Tyler Woods 1945-09-13 [id number]  Referring provider: Sofie Hartigan, MD [place],  [place] [address]  Chief Complaint  Patient presents with   Elevated PSA    Urologic history: 1. Elevated PSA - biopsies 2001 and 2002 for PSAs of 4.2 and 7.8; benign pathology. - Prostate MRI 05/2015 showed no suspicious lesions   2. BPH with lower urinary tract symptoms  -started dutasteride 2008  HPI: 75 y.o. male presents for annual follow-up.  Doing well since last visit No bothersome LUTS, occasional frequency and decreased urinary stream Denies dysuria, gross hematuria Denies flank, abdominal or pelvic pain Remains on dutasteride Uncorrected PSA 08/01/2020 was stable at 2.2   PMH: Past Medical History:  Diagnosis Date   Arthritis    Headache    Hearing aid worn    bilateral   History of DVT (deep vein thrombosis)    History of kidney stones    h/o    Surgical History: Past Surgical History:  Procedure Laterality Date   COLONOSCOPY     COLONOSCOPY WITH PROPOFOL N/A 03/27/2016   Procedure: COLONOSCOPY WITH PROPOFOL;  Surgeon: Lucilla Lame, MD;  Location: [facility];  Service: Endoscopy;  Laterality: N/A;   FOOT SURGERY Right    HERNIA REPAIR Right    inguinal   INGUINAL HERNIA REPAIR Right 02/23/2018   Procedure: LAPAROSCOPIC INGUINAL HERNIA RECURRENT;  Surgeon: Benjamine Sprague, DO;  Location: [facility];  Service: General;  Laterality: Right;   KNEE SURGERY Right    POLYPECTOMY  03/27/2016   Procedure: POLYPECTOMY;  Surgeon: Lucilla Lame, MD;  Location: [facility];  Service: Endoscopy;;   REVERSE SHOULDER ARTHROPLASTY Right 10/06/2017   Procedure: REVERSE SHOULDER ARTHROPLASTY;  Surgeon: Corky Mull, MD;  Location: [facility];  Service: Orthopedics;  Laterality: Right;   SHOULDER ARTHROSCOPY WITH OPEN ROTATOR CUFF REPAIR Left 11/23/2018   Procedure: SHOULDER ARTHROSCOPY WITH DEBRIDEMENT, DECOMPRESSION, AND ROTATOR  CUFF REPAIR.;  Surgeon: Corky Mull, MD;  Location: [facility];  Service: Orthopedics;  Laterality: Left;   SURGERY OF LIP      Home Medications:  Allergies as of 08/06/2020       Reactions   Sulfa Antibiotics Rash        Medication List        Accurate as of August 06, 2020  9:24 AM. If you have any questions, ask your nurse or doctor.          CALCIUM 600+D3 PO Take 1 tablet by mouth daily.   dutasteride 0.5 MG capsule Commonly known as: AVODART Take 1 capsule (0.5 mg total) by mouth at bedtime.   multivitamin with minerals Tabs tablet Take 1 tablet by mouth daily. Centrum Silver   Vitamin B-12 5000 MCG Tbdp Take 5,000 mcg by mouth daily.   Vitamin D3 50 MCG (2000 UT) Tabs Take 2,000 Units by mouth daily.        Allergies:  Allergies  Allergen Reactions   Sulfa Antibiotics Rash    Family History: Family History  Problem Relation Age of Onset   Parkinson's disease Father     Social History:  reports that he has never smoked. He has never used smokeless tobacco. He reports previous drug use. He reports that he does not drink alcohol.   Physical Exam: BP (!) 173/72   Pulse 93   Ht '5\' 6"'$  (1.676 m)   Wt 131 lb (59.4 kg)   BMI 21.14 kg/m  Constitutional:  Alert and oriented, No acute distress. HEENT: [place] AT, moist mucus membranes.  Trachea midline, no masses. Cardiovascular: No clubbing, cyanosis, or edema. Respiratory: Normal respiratory effort, no increased work of breathing. Skin: No rashes, bruises or suspicious lesions. Neurologic: Grossly intact, no focal deficits, moving all 4 extremities. Psychiatric: Normal mood and affect.   Assessment & Plan:    1.  Elevated PSA We again discussed prostate cancer screening guidelines with recommendations between the ages of 63-69.  He has requested to continue annual screening Request DRE every other year Continue annual follow-up   2.  BPH with LUTS Stable Mild lower urinary tract  symptoms Dutasteride refilled    Tyler Sons, MD  [facility] [address], [place], [place] [address] [phone]

## 2020-08-06 ENCOUNTER — Ambulatory Visit: Payer: Medicare PPO | Admitting: Urology

## 2020-08-06 ENCOUNTER — Other Ambulatory Visit: Payer: Self-pay

## 2020-08-06 ENCOUNTER — Encounter: Payer: Self-pay | Admitting: Urology

## 2020-08-06 VITALS — BP 173/72 | HR 93 | Ht 66.0 in | Wt 131.0 lb

## 2020-08-06 DIAGNOSIS — R972 Elevated prostate specific antigen [PSA]: Secondary | ICD-10-CM | POA: Diagnosis not present

## 2020-08-06 DIAGNOSIS — N138 Other obstructive and reflux uropathy: Secondary | ICD-10-CM

## 2020-08-06 DIAGNOSIS — N401 Enlarged prostate with lower urinary tract symptoms: Secondary | ICD-10-CM | POA: Diagnosis not present

## 2020-08-06 MED ORDER — DUTASTERIDE 0.5 MG PO CAPS: 0.5000 mg | ORAL_CAPSULE | Freq: Every day | ORAL | 3 refills | Status: DC

## 2020-08-12 IMAGING — US US ABDOMEN LIMITED
1 series · 14 of 25 positions shown · non-contrast
Comparison: CT abdomen/pelvis 09/29/2014

CLINICAL DATA: Alkaline phosphatase elevation.

EXAM:
ULTRASOUND ABDOMEN LIMITED RIGHT UPPER QUADRANT

[Series 1: us abdomen limited · 0.20mm/px · 14 of 44 slices shown]
[im 1/44]
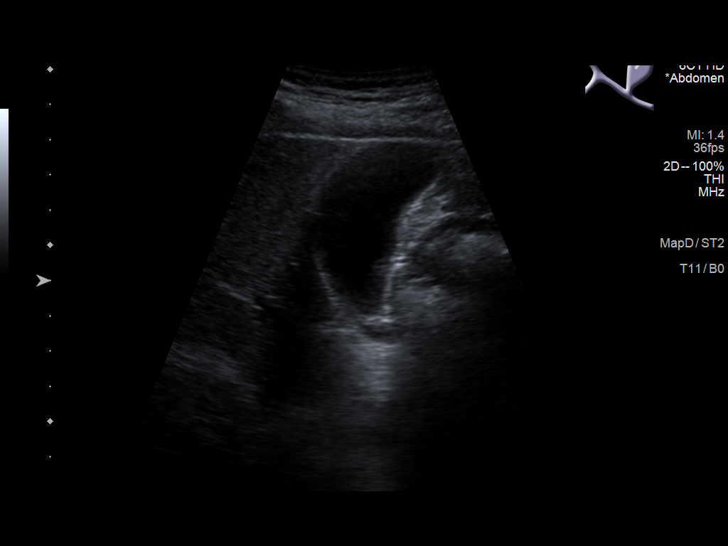
[im 4/44]
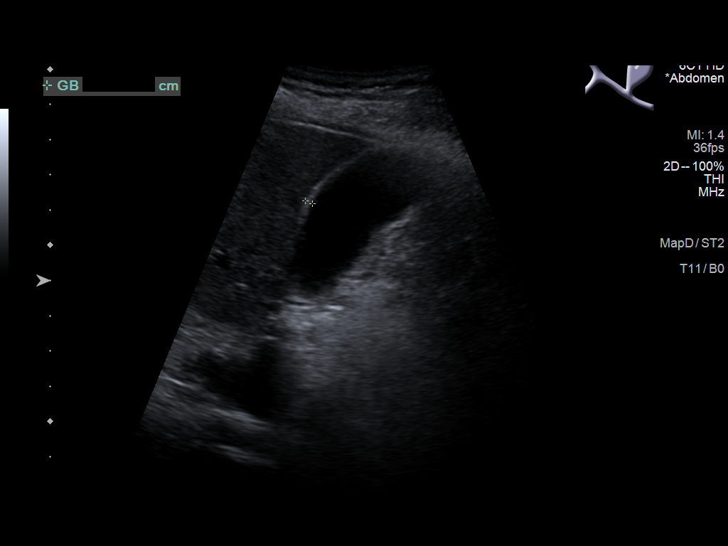
[im 8/44]
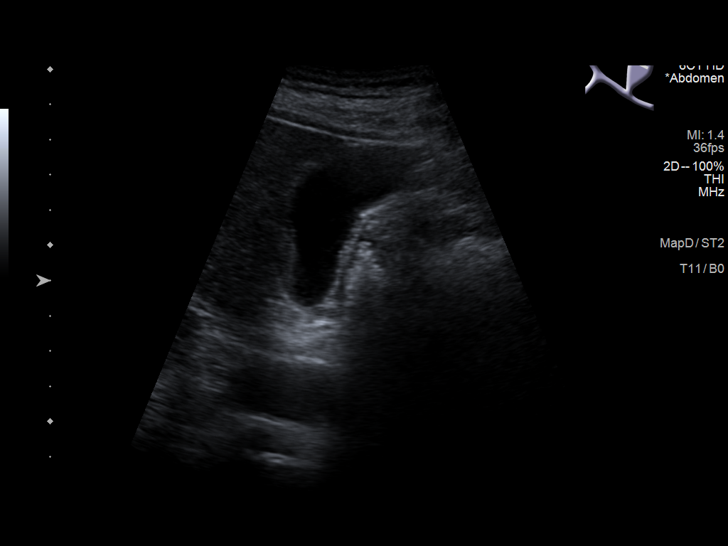
[im 11/44]
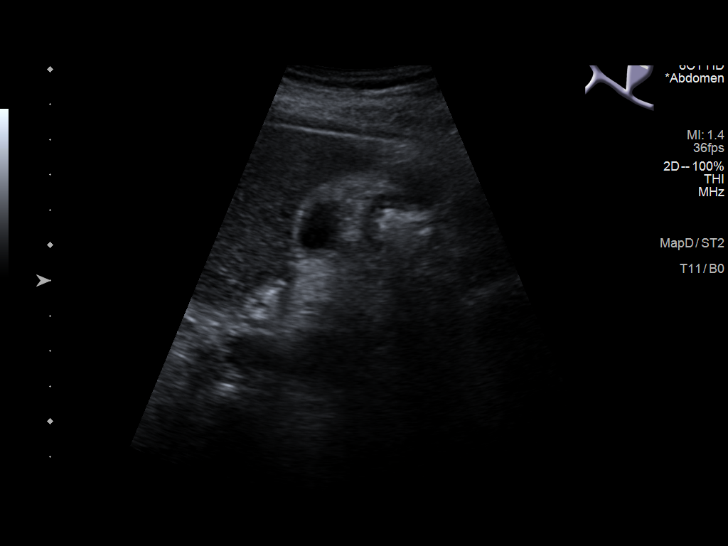
[im 15/44]
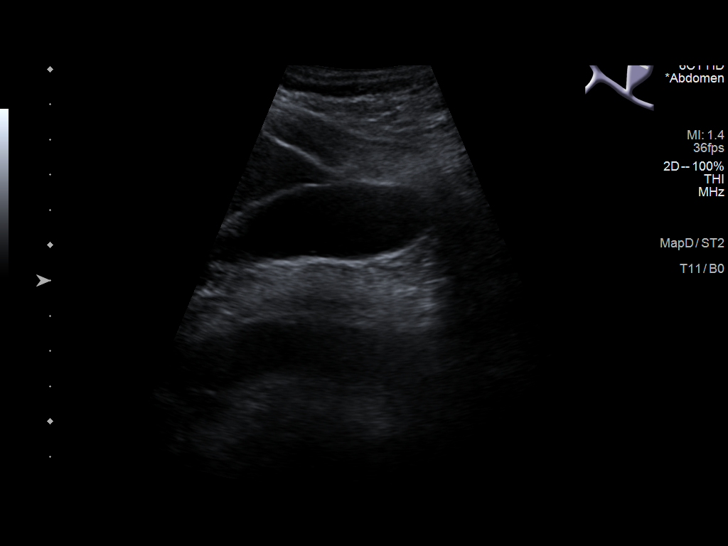
[im 17/44]
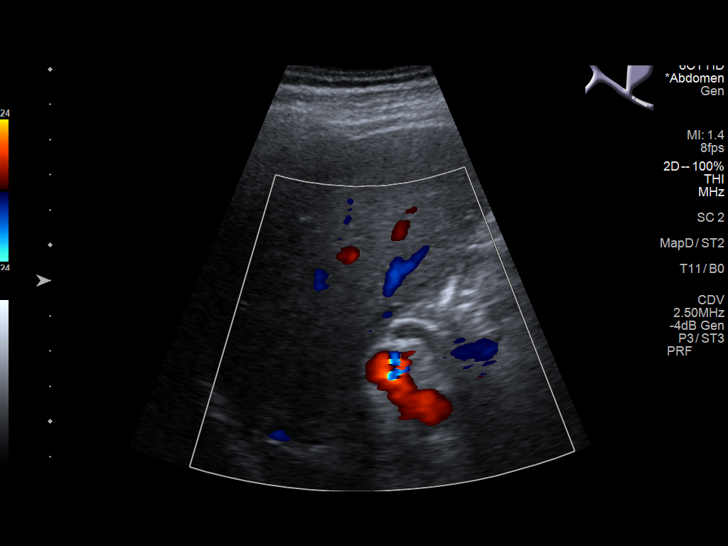
[im 20/44]
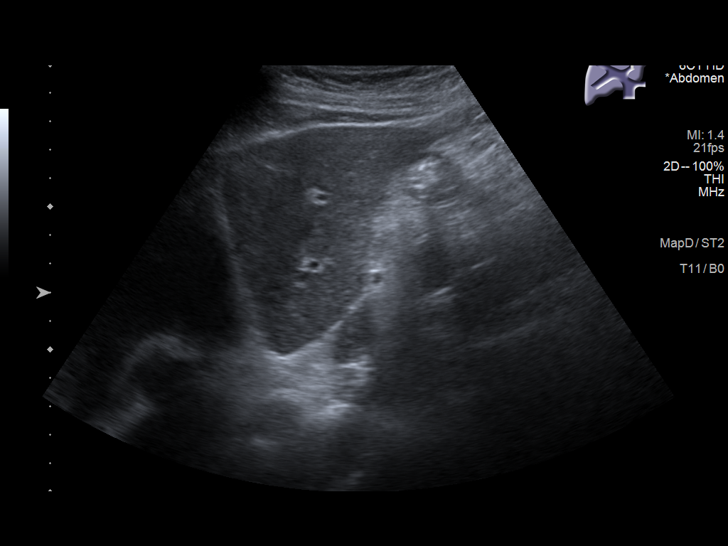
[im 24/44]
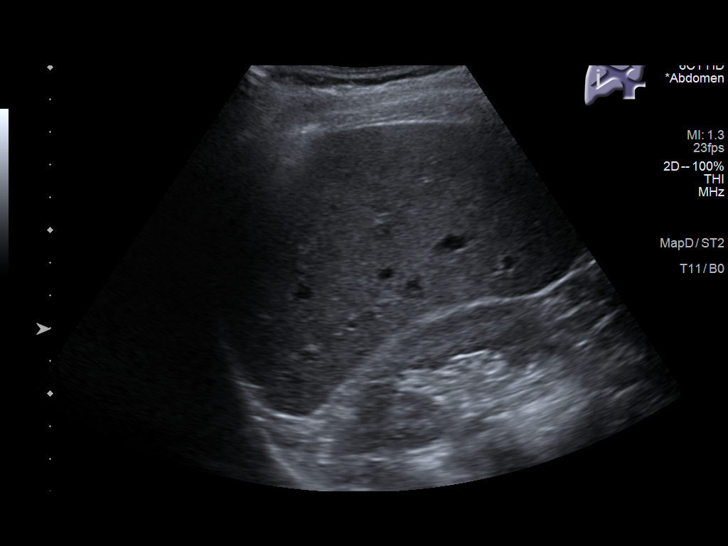
[im 27/44]
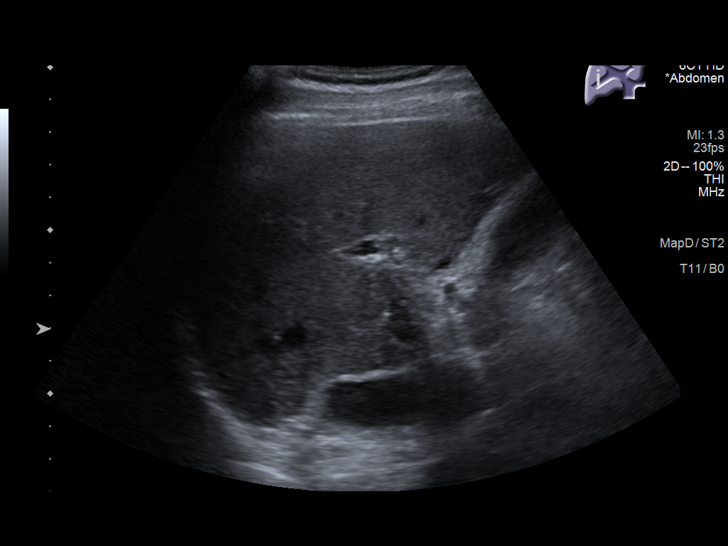
[im 29/44]
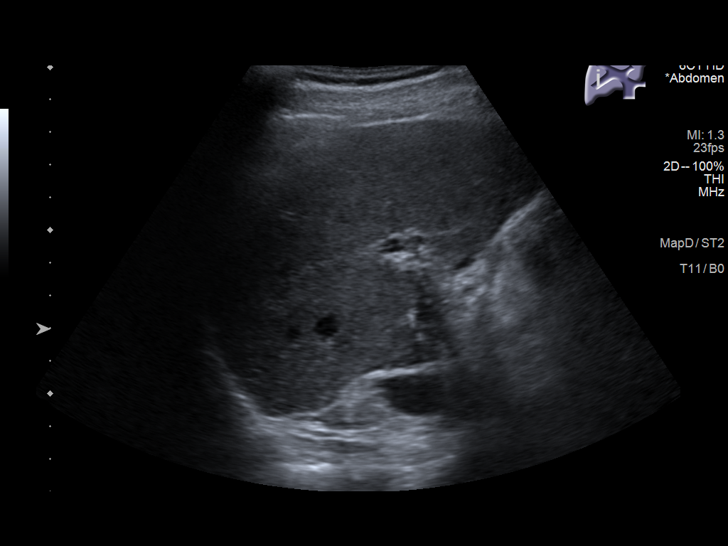
[im 33/44]
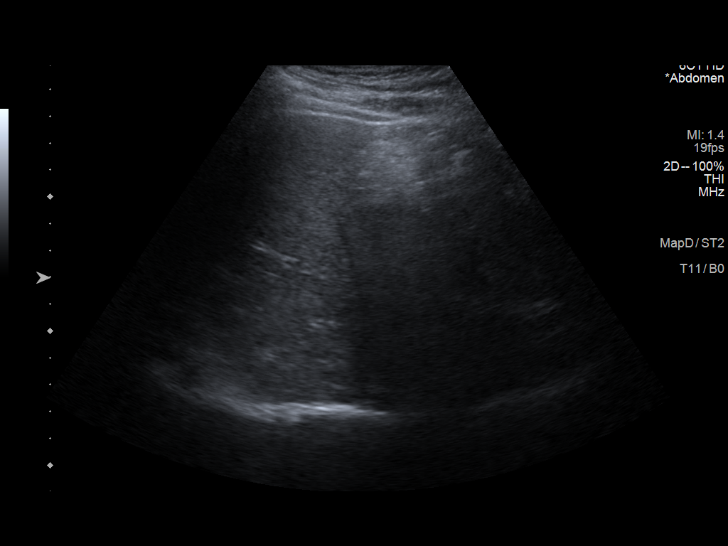
[im 36/44]
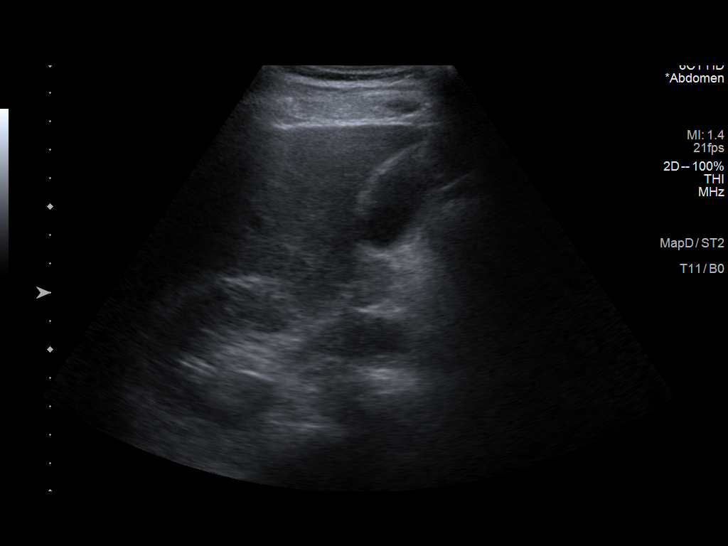
[im 40/44]
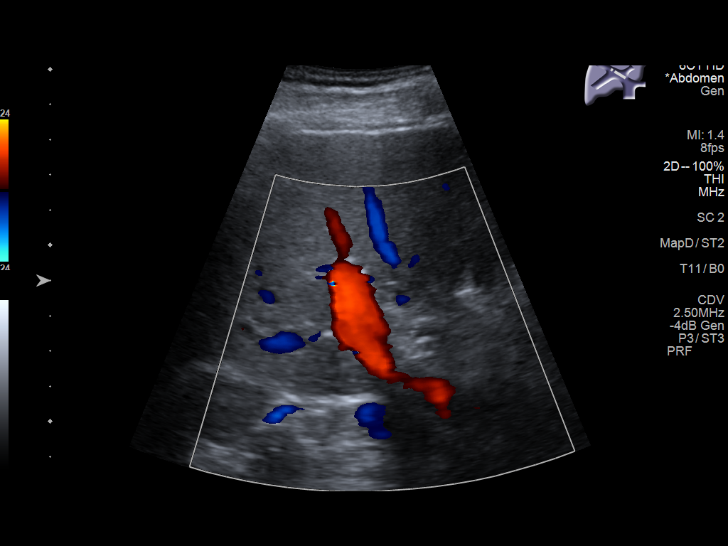
[im 44/44]
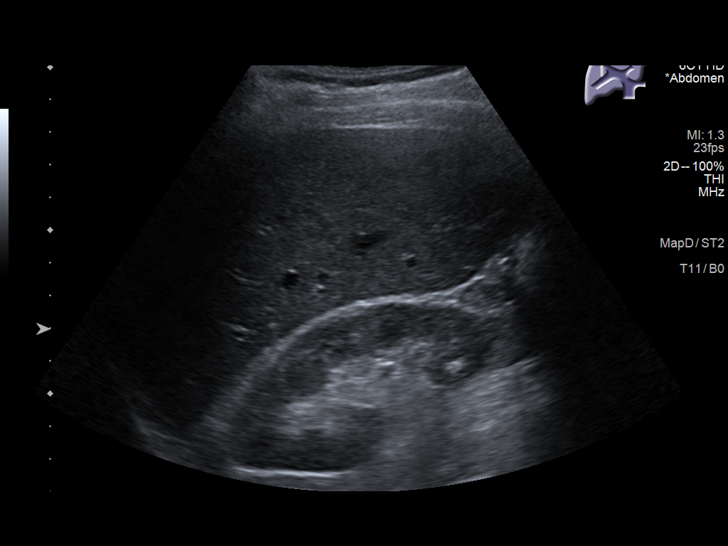

[14 of 25 positions shown; findings below may reference images not displayed]

FINDINGS: Gallbladder:

No gallstones or wall thickening visualized. No sonographic Murphy
sign noted by sonographer.

Common bile duct:

Diameter: 4-5 mm, within normal limits.

Liver:

No focal lesion identified. Within normal limits in parenchymal
echogenicity. Interrogated port vein is patent on color Doppler
imaging with normal direction of blood flow towards the liver.
IMPRESSION: Unremarkable right upper quadrant ultrasound, as described.

## 2020-08-30 ENCOUNTER — Other Ambulatory Visit: Payer: Self-pay

## 2020-11-26 ENCOUNTER — Encounter: Payer: Self-pay | Admitting: Ophthalmology

## 2020-12-03 NOTE — Discharge Instructions (Signed)

## 2020-12-05 ENCOUNTER — Ambulatory Visit
Admission: RE | Admit: 2020-12-05 | Discharge: 2020-12-05 | Disposition: A | Discharge status: Home / Self Care | Payer: Medicare PPO | Attending: Ophthalmology | Admitting: Ophthalmology

## 2020-12-05 ENCOUNTER — Ambulatory Visit: Payer: Medicare PPO | Admitting: Anesthesiology

## 2020-12-05 ENCOUNTER — Encounter: Payer: Self-pay | Admitting: Ophthalmology

## 2020-12-05 ENCOUNTER — Encounter
Admission: RE | Disposition: A | Discharge status: Home / Self Care | Payer: Self-pay | Source: Home / Self Care | Attending: Ophthalmology

## 2020-12-05 ENCOUNTER — Other Ambulatory Visit: Payer: Self-pay

## 2020-12-05 DIAGNOSIS — H2511 Age-related nuclear cataract, right eye: Secondary | ICD-10-CM | POA: Diagnosis present

## 2020-12-05 HISTORY — PX: CATARACT EXTRACTION W/PHACO: SHX586

## 2020-12-05 HISTORY — DX: Presence of external hearing-aid: Z97.4

## 2020-12-05 SURGERY — PHACOEMULSIFICATION, CATARACT, WITH IOL INSERTION
Anesthesia: Monitor Anesthesia Care | Site: Eye | Laterality: Right

## 2020-12-05 MED ORDER — SIGHTPATH DOSE#1 BSS IO SOLN
INTRAOCULAR | Status: DC | PRN
  Administered 2020-12-05: 84 mL via OPHTHALMIC

## 2020-12-05 MED ORDER — ACETAMINOPHEN 160 MG/5ML PO SOLN: 325.0000 mg | Freq: Once | ORAL | Status: DC

## 2020-12-05 MED ORDER — LIDOCAINE HCL (PF) 4 % IJ SOLN
INTRAOCULAR | Status: DC | PRN
  Administered 2020-12-05: 1 mL via OPHTHALMIC

## 2020-12-05 MED ORDER — ACETAMINOPHEN 325 MG PO TABS: 325.0000 mg | ORAL_TABLET | Freq: Once | ORAL | Status: DC

## 2020-12-05 MED ORDER — BRIMONIDINE TARTRATE-TIMOLOL 0.2-0.5 % OP SOLN
OPHTHALMIC | Status: DC | PRN
  Administered 2020-12-05: 1 [drp] via OPHTHALMIC

## 2020-12-05 MED ORDER — LACTATED RINGERS IV SOLN: INTRAVENOUS | Status: DC

## 2020-12-05 MED ORDER — CEFUROXIME OPHTHALMIC INJECTION 1 MG/0.1 ML
INJECTION | OPHTHALMIC | Status: DC | PRN
  Administered 2020-12-05: 0.1 mL via OPHTHALMIC

## 2020-12-05 MED ORDER — ARMC OPHTHALMIC DILATING DROPS
1.0000 "application " | OPHTHALMIC | Status: DC | PRN
  Administered 2020-12-05 (×3): 1 via OPHTHALMIC

## 2020-12-05 MED ORDER — SIGHTPATH DOSE#1 NA HYALUR & NA CHOND-NA HYALUR IO KIT
PACK | INTRAOCULAR | Status: DC | PRN
  Administered 2020-12-05: 1 via OPHTHALMIC

## 2020-12-05 MED ORDER — MIDAZOLAM HCL 2 MG/2ML IJ SOLN
INTRAMUSCULAR | Status: DC | PRN
  Administered 2020-12-05: 1 mg via INTRAVENOUS

## 2020-12-05 MED ORDER — SIGHTPATH DOSE#1 BSS IO SOLN
INTRAOCULAR | Status: DC | PRN
  Administered 2020-12-05: 15 mL

## 2020-12-05 MED ORDER — TETRACAINE HCL 0.5 % OP SOLN
1.0000 [drp] | OPHTHALMIC | Status: DC | PRN
  Administered 2020-12-05 (×3): 1 [drp] via OPHTHALMIC

## 2020-12-05 MED ORDER — FENTANYL CITRATE (PF) 100 MCG/2ML IJ SOLN
INTRAMUSCULAR | Status: DC | PRN
  Administered 2020-12-05: 50 ug via INTRAVENOUS

## 2020-12-05 SURGICAL SUPPLY — 14 items
CANNULA ANT/CHMB 27GA (MISCELLANEOUS) ×2 IMPLANT
GLOVE SRG 8 PF TXTR STRL LF DI (GLOVE) ×1 IMPLANT
GLOVE SURG ENC TEXT LTX SZ7.5 (GLOVE) ×2 IMPLANT
GLOVE SURG UNDER POLY LF SZ8 (GLOVE) ×1
GOWN STRL REUS W/ TWL LRG LVL3 (GOWN DISPOSABLE) ×2 IMPLANT
GOWN STRL REUS W/TWL LRG LVL3 (GOWN DISPOSABLE) ×2
LENS IOL TECNIS EYHANCE 19.5 (Intraocular Lens) ×2 IMPLANT
MARKER SKIN DUAL TIP RULER LAB (MISCELLANEOUS) ×2 IMPLANT
NEEDLE FILTER BLUNT 18X 1/2SAF (NEEDLE) ×2
NEEDLE FILTER BLUNT 18X1 1/2 (NEEDLE) ×2 IMPLANT
SYR 3ML LL SCALE MARK (SYRINGE) ×4 IMPLANT
SYR TB 1ML LUER SLIP (SYRINGE) ×2 IMPLANT
WATER STERILE IRR 250ML POUR (IV SOLUTION) ×2 IMPLANT
WIPE NON LINTING 3.25X3.25 (MISCELLANEOUS) ×2 IMPLANT

## 2020-12-05 NOTE — Anesthesia Postprocedure Evaluation (Signed)
Anesthesia Post Note  Patient: Tyler Woods.  Procedure(s) Performed: CATARACT EXTRACTION PHACO AND INTRAOCULAR LENS PLACEMENT (IOC) RIGHT (Right: Eye)     Patient location during evaluation: PACU Anesthesia Type: MAC Level of consciousness: awake and alert and oriented Pain management: satisfactory to patient Vital Signs Assessment: post-procedure vital signs reviewed and stable Respiratory status: spontaneous breathing, nonlabored ventilation and respiratory function stable Cardiovascular status: blood pressure returned to baseline and stable Postop Assessment: Adequate PO intake and No signs of nausea or vomiting Anesthetic complications: no   No notable events documented.  Raliegh Ip

## 2020-12-05 NOTE — H&P (Signed)
[facility]   Primary Care Physician:  Sofie Hartigan, MD Ophthalmologist: Dr. Leandrew Koyanagi  Pre-Procedure History & Physical: HPI:  Vihaan Gloss. is a 75 y.o. male here for ophthalmic surgery.   Past Medical History:  Diagnosis Date   Arthritis    Headache    Hearing aid worn    bilateral   History of DVT (deep vein thrombosis)    History of kidney stones    h/o   Wears hearing aid in both ears     Past Surgical History:  Procedure Laterality Date   COLONOSCOPY     COLONOSCOPY WITH PROPOFOL N/A 03/27/2016   Procedure: COLONOSCOPY WITH PROPOFOL;  Surgeon: Lucilla Lame, MD;  Location: [place];  Service: Endoscopy;  Laterality: N/A;   FOOT SURGERY Right    HERNIA REPAIR Right    inguinal   INGUINAL HERNIA REPAIR Right 02/23/2018   Procedure: LAPAROSCOPIC INGUINAL HERNIA RECURRENT;  Surgeon: Benjamine Sprague, DO;  Location: [facility];  Service: General;  Laterality: Right;   KNEE SURGERY Right    POLYPECTOMY  03/27/2016   Procedure: POLYPECTOMY;  Surgeon: Lucilla Lame, MD;  Location: [place];  Service: Endoscopy;;   REVERSE SHOULDER ARTHROPLASTY Right 10/06/2017   Procedure: REVERSE SHOULDER ARTHROPLASTY;  Surgeon: Corky Mull, MD;  Location: [facility];  Service: Orthopedics;  Laterality: Right;   SHOULDER ARTHROSCOPY WITH OPEN ROTATOR CUFF REPAIR Left 11/23/2018   Procedure: SHOULDER ARTHROSCOPY WITH DEBRIDEMENT, DECOMPRESSION, AND ROTATOR CUFF REPAIR.;  Surgeon: Corky Mull, MD;  Location: [facility];  Service: Orthopedics;  Laterality: Left;   SURGERY OF LIP      Prior to Admission medications   Medication Sig Start Date End Date Taking? Authorizing Provider  Calcium Carb-Cholecalciferol (CALCIUM 600+D3 PO) Take 1 tablet by mouth daily.   Yes [provider]  Cholecalciferol (VITAMIN D3) 2000 units TABS Take 2,000 Units by mouth daily.   Yes [provider]  dutasteride (AVODART) 0.5 MG capsule Take 1 capsule (0.5  mg total) by mouth at bedtime. 08/06/20  Yes Stoioff, Ronda Fairly, MD  Multiple Vitamin (MULTIVITAMIN WITH MINERALS) TABS tablet Take 1 tablet by mouth daily. Centrum Silver   Yes [provider]  Cyanocobalamin (VITAMIN B-12) 5000 MCG TBDP Take 5,000 mcg by mouth daily. Patient not taking: Reported on 11/26/2020    [provider]    Allergies as of 11/07/2020 - Review Complete 08/06/2020  Allergen Reaction Noted   Sulfa antibiotics Rash 09/16/2013    Family History  Problem Relation Age of Onset   Parkinson's disease Father     Social History   Socioeconomic History   Marital status: Married    Spouse name: Not on file   Number of children: Not on file   Years of education: Not on file   Highest education level: Not on file  Occupational History   Not on file  Tobacco Use   Smoking status: Never   Smokeless tobacco: Never  Vaping Use   Vaping Use: Never used  Substance and Sexual Activity   Alcohol use: No   Drug use: Not Currently   Sexual activity: Yes    Birth control/protection: None  Other Topics Concern   Not on file  Social History Narrative   Not on file   Social Determinants of Health   Financial Resource Strain: Not on file  Food Insecurity: Not on file  Transportation Needs: Not on file  Physical Activity: Not on file  Stress:  Not on file  Social Connections: Not on file  Intimate Partner Violence: Not on file    Review of Systems: See HPI, otherwise negative ROS  Physical Exam: BP (!) 164/91   Pulse 69   Temp 97.7 F (36.5 C) (Temporal)   Resp (!) 22   Ht 5\' 6"  (1.676 m)   Wt 59.9 kg   SpO2 100%   BMI 21.31 kg/m  General:   Alert,  pleasant and cooperative in NAD Head:  Normocephalic and atraumatic. Lungs:  Clear to auscultation.    Heart:  Regular rate and rhythm.   Impression/Plan: [name]. is here for ophthalmic surgery.  Risks, benefits, limitations, and alternatives regarding ophthalmic surgery have  been reviewed with the patient.  Questions have been answered.  All parties agreeable.   Leandrew Koyanagi, MD  12/05/2020, 8:52 AM

## 2020-12-05 NOTE — Anesthesia Procedure Notes (Signed)
Procedure Name: MAC Date/Time: 12/05/2020 9:23 AM Performed by: Dionne Bucy, CRNA Pre-anesthesia Checklist: Patient identified, Emergency Drugs available, Suction available, Patient being monitored and Timeout performed Patient Re-evaluated:Patient Re-evaluated prior to induction Oxygen Delivery Method: Nasal cannula Placement Confirmation: positive ETCO2

## 2020-12-05 NOTE — Transfer of Care (Signed)
Immediate Anesthesia Transfer of Care Note  Patient: Tyler Woods.  Procedure(s) Performed: CATARACT EXTRACTION PHACO AND INTRAOCULAR LENS PLACEMENT (IOC) RIGHT (Right: Eye)  Patient Location: PACU  Anesthesia Type: MAC  Level of Consciousness: awake, alert  and patient cooperative  Airway and Oxygen Therapy: Patient Spontanous Breathing and Patient connected to supplemental oxygen  Post-op Assessment: Post-op Vital signs reviewed, Patient's Cardiovascular Status Stable, Respiratory Function Stable, Patent Airway and No signs of Nausea or vomiting  Post-op Vital Signs: Reviewed and stable  Complications: No notable events documented.

## 2020-12-05 NOTE — Anesthesia Preprocedure Evaluation (Signed)
Anesthesia Evaluation  Patient identified by MRN, date of birth, ID band Patient awake    Reviewed: Allergy & Precautions, H&P , NPO status , Patient's Chart, lab work & pertinent test results  Airway Mallampati: II  TM Distance: >3 FB Neck ROM: full    Dental no notable dental hx.    Pulmonary    Pulmonary exam normal breath sounds clear to auscultation       Cardiovascular Normal cardiovascular exam Rhythm:regular Rate:Normal     Neuro/Psych  Headaches,    GI/Hepatic   Endo/Other    Renal/GU      Musculoskeletal   Abdominal   Peds  Hematology   Anesthesia Other Findings   Reproductive/Obstetrics                             Anesthesia Physical Anesthesia Plan  ASA: 2  Anesthesia Plan: MAC   Post-op Pain Management:    Induction:   PONV Risk Score and Plan: 1 and Treatment may vary due to age or medical condition, TIVA and Midazolam  Airway Management Planned:   Additional Equipment:   Intra-op Plan:   Post-operative Plan:   Informed Consent: I have reviewed the patients History and Physical, chart, labs and discussed the procedure including the risks, benefits and alternatives for the proposed anesthesia with the patient or authorized representative who has indicated his/her understanding and acceptance.     Dental Advisory Given  Plan Discussed with: CRNA  Anesthesia Plan Comments:         Anesthesia Quick Evaluation

## 2020-12-05 NOTE — Op Note (Signed)
  LOCATION:  [place]   PREOPERATIVE DIAGNOSIS:    Nuclear sclerotic cataract right eye. H25.11   POSTOPERATIVE DIAGNOSIS:  Nuclear sclerotic cataract right eye.     PROCEDURE:  Phacoemusification with posterior chamber intraocular lens placement of the right eye   ULTRASOUND TIME: Procedure(s) with comments: CATARACT EXTRACTION PHACO AND INTRAOCULAR LENS PLACEMENT (IOC) RIGHT (Right) - 17.32 01:45.1  LENS:   Implant Name Type Inv. Item Serial No. Manufacturer Lot No. LRB No. Used Action  LENS IOL TECNIS EYHANCE 19.5 - [id number] Intraocular Lens LENS IOL TECNIS EYHANCE 19.5 [id number] JOHNSON   Right 1 Implanted         SURGEON:  Wyonia Hough, MD   ANESTHESIA:  Topical with tetracaine drops and 2% Xylocaine jelly, augmented with 1% preservative-free intracameral lidocaine.    COMPLICATIONS:  None.   DESCRIPTION OF PROCEDURE:  The patient was identified in the holding room and transported to the operating room and placed in the supine position under the operating microscope.  The right eye was identified as the operative eye and it was prepped and draped in the usual sterile ophthalmic fashion.   A 1 millimeter clear-corneal paracentesis was made at the 12:00 position.  0.5 ml of preservative-free 1% lidocaine was injected into the anterior chamber. The anterior chamber was filled with Viscoat viscoelastic.  A 2.4 millimeter keratome was used to make a near-clear corneal incision at the 9:00 position.  A curvilinear capsulorrhexis was made with a cystotome and capsulorrhexis forceps.  Balanced salt solution was used to hydrodissect and hydrodelineate the nucleus.   Phacoemulsification was then used in stop and chop fashion to remove the lens nucleus and epinucleus.  The remaining cortex was then removed using the irrigation and aspiration handpiece. Provisc was then placed into the capsular bag to distend it for lens placement.  A lens was then injected into the  capsular bag.  The remaining viscoelastic was aspirated.   Wounds were hydrated with balanced salt solution.  The anterior chamber was inflated to a physiologic pressure with balanced salt solution.  No wound leaks were noted. Cefuroxime 0.1 ml of a 10mg /ml solution was injected into the anterior chamber for a dose of 1 mg of intracameral antibiotic at the completion of the case.   Timolol and Brimonidine drops were applied to the eye.  The patient was taken to the recovery room in stable condition without complications of anesthesia or surgery.   Vernor Monnig 12/05/2020, 9:45 AM

## 2020-12-10 ENCOUNTER — Encounter: Payer: Self-pay | Admitting: Ophthalmology

## 2020-12-17 NOTE — Discharge Instructions (Signed)

## 2020-12-19 ENCOUNTER — Ambulatory Visit: Payer: Medicare PPO | Admitting: Anesthesiology

## 2020-12-19 ENCOUNTER — Ambulatory Visit
Admission: RE | Admit: 2020-12-19 | Discharge: 2020-12-19 | Disposition: A | Discharge status: Home / Self Care | Payer: Medicare PPO | Attending: Ophthalmology | Admitting: Ophthalmology

## 2020-12-19 ENCOUNTER — Encounter
Admission: RE | Disposition: A | Discharge status: Home / Self Care | Payer: Self-pay | Source: Home / Self Care | Attending: Ophthalmology

## 2020-12-19 ENCOUNTER — Encounter: Payer: Self-pay | Admitting: Ophthalmology

## 2020-12-19 ENCOUNTER — Other Ambulatory Visit: Payer: Self-pay

## 2020-12-19 DIAGNOSIS — H2512 Age-related nuclear cataract, left eye: Secondary | ICD-10-CM | POA: Diagnosis present

## 2020-12-19 DIAGNOSIS — F17200 Nicotine dependence, unspecified, uncomplicated: Secondary | ICD-10-CM | POA: Diagnosis not present

## 2020-12-19 HISTORY — PX: CATARACT EXTRACTION W/PHACO: SHX586

## 2020-12-19 SURGERY — PHACOEMULSIFICATION, CATARACT, WITH IOL INSERTION
Anesthesia: Monitor Anesthesia Care | Site: Eye | Laterality: Left

## 2020-12-19 MED ORDER — SIGHTPATH DOSE#1 BSS IO SOLN
INTRAOCULAR | Status: DC | PRN
  Administered 2020-12-19: 15 mL

## 2020-12-19 MED ORDER — SIGHTPATH DOSE#1 BSS IO SOLN
INTRAOCULAR | Status: DC | PRN
  Administered 2020-12-19: 1 mL via INTRAMUSCULAR

## 2020-12-19 MED ORDER — LACTATED RINGERS IV SOLN: INTRAVENOUS | Status: DC

## 2020-12-19 MED ORDER — NEOMYCIN-POLYMYXIN-DEXAMETH 3.5-10000-0.1 OP OINT
TOPICAL_OINTMENT | OPHTHALMIC | Status: DC | PRN
  Administered 2020-12-19: 1 via OPHTHALMIC

## 2020-12-19 MED ORDER — TETRACAINE HCL 0.5 % OP SOLN
1.0000 [drp] | OPHTHALMIC | Status: DC | PRN
  Administered 2020-12-19 (×3): 1 [drp] via OPHTHALMIC

## 2020-12-19 MED ORDER — ONDANSETRON HCL 4 MG/2ML IJ SOLN: 4.0000 mg | Freq: Once | INTRAMUSCULAR | Status: DC | PRN

## 2020-12-19 MED ORDER — FENTANYL CITRATE (PF) 100 MCG/2ML IJ SOLN
INTRAMUSCULAR | Status: DC | PRN
  Administered 2020-12-19: 50 ug via INTRAVENOUS

## 2020-12-19 MED ORDER — ARMC OPHTHALMIC DILATING DROPS
1.0000 "application " | OPHTHALMIC | Status: DC | PRN
  Administered 2020-12-19 (×3): 1 via OPHTHALMIC

## 2020-12-19 MED ORDER — CEFUROXIME OPHTHALMIC INJECTION 1 MG/0.1 ML
INJECTION | OPHTHALMIC | Status: DC | PRN
  Administered 2020-12-19: 0.1 mL via INTRACAMERAL

## 2020-12-19 MED ORDER — SIGHTPATH DOSE#1 BSS IO SOLN
INTRAOCULAR | Status: DC | PRN
  Administered 2020-12-19: 64 mL via OPHTHALMIC

## 2020-12-19 MED ORDER — ACETAMINOPHEN 10 MG/ML IV SOLN: 1000.0000 mg | Freq: Once | INTRAVENOUS | Status: DC | PRN

## 2020-12-19 MED ORDER — BRIMONIDINE TARTRATE-TIMOLOL 0.2-0.5 % OP SOLN
OPHTHALMIC | Status: DC | PRN
  Administered 2020-12-19: 1 [drp] via OPHTHALMIC

## 2020-12-19 MED ORDER — MIDAZOLAM HCL 2 MG/2ML IJ SOLN
INTRAMUSCULAR | Status: DC | PRN
  Administered 2020-12-19: 1 mg via INTRAVENOUS

## 2020-12-19 MED ORDER — SIGHTPATH DOSE#1 NA HYALUR & NA CHOND-NA HYALUR IO KIT
PACK | INTRAOCULAR | Status: DC | PRN
  Administered 2020-12-19: 1 via OPHTHALMIC

## 2020-12-19 SURGICAL SUPPLY — 7 items
GLOVE SURG ENC TEXT LTX SZ7.5 (GLOVE) ×2 IMPLANT
LENS IOL TECNIS EYHANCE 19.5 (Intraocular Lens) ×2 IMPLANT
NEEDLE FILTER BLUNT 18X 1/2SAF (NEEDLE) ×2
NEEDLE FILTER BLUNT 18X1 1/2 (NEEDLE) ×2 IMPLANT
PACK EYE AFTER SURG (MISCELLANEOUS) ×2 IMPLANT
PACK VIT ANT 23G (MISCELLANEOUS) IMPLANT
WATER STERILE IRR 250ML POUR (IV SOLUTION) ×2 IMPLANT

## 2020-12-19 NOTE — Anesthesia Postprocedure Evaluation (Signed)
Anesthesia Post Note  Patient: Tyler Woods.  Procedure(s) Performed: CATARACT EXTRACTION PHACO AND INTRAOCULAR LENS PLACEMENT (IOC) LEFT 7.40 00:58.8 (Left: Eye)     Patient location during evaluation: PACU Anesthesia Type: MAC Level of consciousness: awake and alert Pain management: pain level controlled Vital Signs Assessment: post-procedure vital signs reviewed and stable Respiratory status: spontaneous breathing, nonlabored ventilation, respiratory function stable and patient connected to nasal cannula oxygen Cardiovascular status: stable and blood pressure returned to baseline Postop Assessment: no apparent nausea or vomiting Anesthetic complications: no   No notable events documented.  Dacen Frayre A  Hertha Gergen

## 2020-12-19 NOTE — Transfer of Care (Signed)
Immediate Anesthesia Transfer of Care Note  Patient: Tyler Woods.  Procedure(s) Performed: CATARACT EXTRACTION PHACO AND INTRAOCULAR LENS PLACEMENT (IOC) LEFT 7.40 00:58.8 (Left: Eye)  Patient Location: PACU  Anesthesia Type: MAC  Level of Consciousness: awake, alert  and patient cooperative  Airway and Oxygen Therapy: Patient Spontanous Breathing and Patient connected to supplemental oxygen  Post-op Assessment: Post-op Vital signs reviewed, Patient's Cardiovascular Status Stable, Respiratory Function Stable, Patent Airway and No signs of Nausea or vomiting  Post-op Vital Signs: Reviewed and stable  Complications: No notable events documented.

## 2020-12-19 NOTE — Anesthesia Procedure Notes (Signed)
Date/Time: 12/19/2020 9:55 AM Performed by: Mayme Genta, CRNA Pre-anesthesia Checklist: Patient identified, Emergency Drugs available, Suction available, Timeout performed and Patient being monitored Patient Re-evaluated:Patient Re-evaluated prior to induction Oxygen Delivery Method: Nasal cannula Placement Confirmation: positive ETCO2

## 2020-12-19 NOTE — Op Note (Signed)
OPERATIVE NOTE  Tyler Woods [id number] 12/19/2020   PREOPERATIVE DIAGNOSIS:  Nuclear sclerotic cataract left eye. H25.12   POSTOPERATIVE DIAGNOSIS:    Nuclear sclerotic cataract left eye.     PROCEDURE:  Phacoemusification with posterior chamber intraocular lens placement of the left eye  Ultrasound time: Procedure(s): CATARACT EXTRACTION PHACO AND INTRAOCULAR LENS PLACEMENT (IOC) LEFT 7.40 00:58.8 (Left)  LENS:   Implant Name Type Inv. Item Serial No. Manufacturer Lot No. LRB No. Used Action  LENS IOL TECNIS EYHANCE 19.5 - [id number] Intraocular Lens LENS IOL TECNIS EYHANCE 19.5 [id number] JOHNSON   Left 1 Implanted      SURGEON:  Wyonia Hough, MD   ANESTHESIA:  Topical with tetracaine drops and 2% Xylocaine jelly, augmented with 1% preservative-free intracameral lidocaine.    COMPLICATIONS:  None.   DESCRIPTION OF PROCEDURE:  The patient was identified in the holding room and transported to the operating room and placed in the supine position under the operating microscope.  The left eye was identified as the operative eye and it was prepped and draped in the usual sterile ophthalmic fashion.   A 1 millimeter clear-corneal paracentesis was made at the 1:30 position.  0.5 ml of preservative-free 1% lidocaine was injected into the anterior chamber.  The anterior chamber was filled with Viscoat viscoelastic.  A 2.4 millimeter keratome was used to make a near-clear corneal incision at the 10:30 position.  .  A curvilinear capsulorrhexis was made with a cystotome and capsulorrhexis forceps.  Balanced salt solution was used to hydrodissect and hydrodelineate the nucleus.   Phacoemulsification was then used in stop and chop fashion to remove the lens nucleus and epinucleus.  The remaining cortex was then removed using the irrigation and aspiration handpiece. Provisc was then placed into the capsular bag to distend it for lens placement.  A lens was then injected into the  capsular bag.  The remaining viscoelastic was aspirated.   Wounds were hydrated with balanced salt solution.  The anterior chamber was inflated to a physiologic pressure with balanced salt solution.  No wound leaks were noted. Cefuroxime 0.1 ml of a 10mg /ml solution was injected into the anterior chamber for a dose of 1 mg of intracameral antibiotic at the completion of the case.   Timolol and Brimonidine drops and maxitrol ointment were applied to the eye.  The patient was taken to the recovery room in stable condition without complications of anesthesia or surgery.  Tyler Woods 12/19/2020, 10:21 AM

## 2020-12-19 NOTE — Anesthesia Preprocedure Evaluation (Signed)
Anesthesia Evaluation  Patient identified by MRN, date of birth, ID band Patient awake    Reviewed: Allergy & Precautions, H&P , NPO status , Patient's Chart, lab work & pertinent test results, reviewed documented beta blocker date and time   History of Anesthesia Complications Negative for: history of anesthetic complications  Airway Mallampati: II  TM Distance: >3 FB Neck ROM: Limited    Dental no notable dental hx.    Pulmonary Current SmokerPatient did not abstain from smoking.,    Pulmonary exam normal breath sounds clear to auscultation       Cardiovascular hypertension, Pt. on medications and Pt. on home beta blockers (-) angina+ DVT  (-) DOE Normal cardiovascular exam+ dysrhythmias  Rhythm:regular Rate:Normal     Neuro/Psych  Headaches, Anxiety    GI/Hepatic neg GERD  ,  Endo/Other  Hypothyroidism   Renal/GU      Musculoskeletal  (+) Arthritis ,   Abdominal   Peds  Hematology   Anesthesia Other Findings   Reproductive/Obstetrics                             Anesthesia Physical  Anesthesia Plan  ASA: 2  Anesthesia Plan: MAC   Post-op Pain Management:    Induction:   PONV Risk Score and Plan: 1 and Treatment may vary due to age or medical condition, TIVA and Midazolam  Airway Management Planned:   Additional Equipment:   Intra-op Plan:   Post-operative Plan:   Informed Consent: I have reviewed the patients History and Physical, chart, labs and discussed the procedure including the risks, benefits and alternatives for the proposed anesthesia with the patient or authorized representative who has indicated his/her understanding and acceptance.     Dental Advisory Given  Plan Discussed with: CRNA  Anesthesia Plan Comments:         Anesthesia Quick Evaluation

## 2020-12-19 NOTE — H&P (Signed)
[facility]   Primary Care Physician:  Sofie Hartigan, MD Ophthalmologist: Dr. Leandrew Koyanagi  Pre-Procedure History & Physical: HPI:  Tyler Woods. is a 75 y.o. male here for ophthalmic surgery.   Past Medical History:  Diagnosis Date   Arthritis    Headache    Hearing aid worn    bilateral   History of DVT (deep vein thrombosis)    History of kidney stones    h/o   Wears hearing aid in both ears     Past Surgical History:  Procedure Laterality Date   CATARACT EXTRACTION W/PHACO Right 12/05/2020   Procedure: CATARACT EXTRACTION PHACO AND INTRAOCULAR LENS PLACEMENT ([place]) RIGHT;  Surgeon: Leandrew Koyanagi, MD;  Location: [place];  Service: Ophthalmology;  Laterality: Right;  17.32 01:45.1   COLONOSCOPY     COLONOSCOPY WITH PROPOFOL N/A 03/27/2016   Procedure: COLONOSCOPY WITH PROPOFOL;  Surgeon: Lucilla Lame, MD;  Location: [place];  Service: Endoscopy;  Laterality: N/A;   FOOT SURGERY Right    HERNIA REPAIR Right    inguinal   INGUINAL HERNIA REPAIR Right 02/23/2018   Procedure: LAPAROSCOPIC INGUINAL HERNIA RECURRENT;  Surgeon: Benjamine Sprague, DO;  Location: [facility];  Service: General;  Laterality: Right;   KNEE SURGERY Right    POLYPECTOMY  03/27/2016   Procedure: POLYPECTOMY;  Surgeon: Lucilla Lame, MD;  Location: [place];  Service: Endoscopy;;   REVERSE SHOULDER ARTHROPLASTY Right 10/06/2017   Procedure: REVERSE SHOULDER ARTHROPLASTY;  Surgeon: Corky Mull, MD;  Location: [facility];  Service: Orthopedics;  Laterality: Right;   SHOULDER ARTHROSCOPY WITH OPEN ROTATOR CUFF REPAIR Left 11/23/2018   Procedure: SHOULDER ARTHROSCOPY WITH DEBRIDEMENT, DECOMPRESSION, AND ROTATOR CUFF REPAIR.;  Surgeon: Corky Mull, MD;  Location: [facility];  Service: Orthopedics;  Laterality: Left;   SURGERY OF LIP      Prior to Admission medications   Medication Sig Start Date End Date Taking? Authorizing Provider  Calcium  Carb-Cholecalciferol (CALCIUM 600+D3 PO) Take 1 tablet by mouth daily.   Yes [provider]  Cholecalciferol (VITAMIN D3) 2000 units TABS Take 2,000 Units by mouth daily.   Yes [provider]  Cyanocobalamin (VITAMIN B-12) 5000 MCG TBDP Take 5,000 mcg by mouth daily.   Yes [provider]  dutasteride (AVODART) 0.5 MG capsule Take 1 capsule (0.5 mg total) by mouth at bedtime. 08/06/20  Yes Stoioff, Ronda Fairly, MD  Multiple Vitamin (MULTIVITAMIN WITH MINERALS) TABS tablet Take 1 tablet by mouth daily. Centrum Silver   Yes [provider]    Allergies as of 11/07/2020 - Review Complete 08/06/2020  Allergen Reaction Noted   Sulfa antibiotics Rash 09/16/2013    Family History  Problem Relation Age of Onset   Parkinson's disease Father     Social History   Socioeconomic History   Marital status: Married    Spouse name: Not on file   Number of children: Not on file   Years of education: Not on file   Highest education level: Not on file  Occupational History   Not on file  Tobacco Use   Smoking status: Never   Smokeless tobacco: Never  Vaping Use   Vaping Use: Never used  Substance and Sexual Activity   Alcohol use: No   Drug use: Not Currently   Sexual activity: Yes    Birth control/protection: None  Other Topics Concern   Not on file  Social History Narrative   Not on file  Social Determinants of Health   Financial Resource Strain: Not on file  Food Insecurity: Not on file  Transportation Needs: Not on file  Physical Activity: Not on file  Stress: Not on file  Social Connections: Not on file  Intimate Partner Violence: Not on file    Review of Systems: See HPI, otherwise negative ROS  Physical Exam: BP (!) 136/99   Pulse 84   Temp 98.4 F (36.9 C) (Temporal)   Resp 15   Wt 59.9 kg   SpO2 98%   BMI 21.31 kg/m  General:   Alert,  pleasant and cooperative in NAD Head:  Normocephalic and atraumatic. Lungs:  Clear to  auscultation.    Heart:  Regular rate and rhythm.   Impression/Plan: Tyler Woods. is here for ophthalmic surgery.  Risks, benefits, limitations, and alternatives regarding ophthalmic surgery have been reviewed with the patient.  Questions have been answered.  All parties agreeable.   Leandrew Koyanagi, MD  12/19/2020, 9:21 AM

## 2020-12-20 ENCOUNTER — Encounter: Payer: Self-pay | Admitting: Ophthalmology

## 2021-07-18 ENCOUNTER — Other Ambulatory Visit: Payer: Self-pay | Admitting: Family Medicine

## 2021-08-02 ENCOUNTER — Other Ambulatory Visit: Payer: Medicare PPO

## 2021-08-02 DIAGNOSIS — R972 Elevated prostate specific antigen [PSA]: Secondary | ICD-10-CM | POA: Diagnosis not present

## 2021-08-03 LAB — PSA: Prostate Specific Ag, Serum: 2.4 ng/mL (ref 0.0–4.0)

## 2021-08-07 ENCOUNTER — Encounter: Payer: Self-pay | Admitting: Urology

## 2021-08-07 ENCOUNTER — Ambulatory Visit: Payer: Medicare PPO | Admitting: Urology

## 2021-08-07 VITALS — BP 146/78 | HR 66 | Ht 67.0 in | Wt 130.0 lb

## 2021-08-07 DIAGNOSIS — N401 Enlarged prostate with lower urinary tract symptoms: Secondary | ICD-10-CM | POA: Diagnosis not present

## 2021-08-07 DIAGNOSIS — R972 Elevated prostate specific antigen [PSA]: Secondary | ICD-10-CM

## 2021-08-07 MED ORDER — DUTASTERIDE 0.5 MG PO CAPS: 0.5000 mg | ORAL_CAPSULE | Freq: Every day | ORAL | 3 refills | Status: DC

## 2021-08-07 NOTE — Progress Notes (Signed)
08/07/2021 10:51 AM   Tyler Woods Feb 04, 1945 [id number]  Referring provider: Sofie Hartigan, MD [place],  [place] [address]  Chief Complaint  Patient presents with   Elevated PSA    Urologic history: 1. Elevated PSA - biopsies 2001 and 2002 for PSAs of 4.2 and 7.8; benign pathology. - Prostate MRI 05/2015 showed no suspicious lesions   2. BPH with lower urinary tract symptoms  -started dutasteride 2008  HPI: 76 y.o. male presents for annual follow-up.  Doing well since last visit No bothersome LUTS, occasional frequency and decreased urinary stream Denies dysuria, gross hematuria Denies flank, abdominal or pelvic pain Remains on dutasteride Uncorrected PSA 08/02/2021 was stable at 2.4   PMH: Past Medical History:  Diagnosis Date   Arthritis    Headache    Hearing aid worn    bilateral   History of DVT (deep vein thrombosis)    History of kidney stones    h/o   Wears hearing aid in both ears     Surgical History: Past Surgical History:  Procedure Laterality Date   CATARACT EXTRACTION W/PHACO Right 12/05/2020   Procedure: CATARACT EXTRACTION PHACO AND INTRAOCULAR LENS PLACEMENT ([place]) RIGHT;  Surgeon: Leandrew Koyanagi, MD;  Location: [place];  Service: Ophthalmology;  Laterality: Right;  17.32 01:45.1   CATARACT EXTRACTION W/PHACO Left 12/19/2020   Procedure: CATARACT EXTRACTION PHACO AND INTRAOCULAR LENS PLACEMENT (IOC) LEFT 7.40 00:58.8;  Surgeon: Leandrew Koyanagi, MD;  Location: [place];  Service: Ophthalmology;  Laterality: Left;   COLONOSCOPY     COLONOSCOPY WITH PROPOFOL N/A 03/27/2016   Procedure: COLONOSCOPY WITH PROPOFOL;  Surgeon: Lucilla Lame, MD;  Location: [place];  Service: Endoscopy;  Laterality: N/A;   FOOT SURGERY Right    HERNIA REPAIR Right    inguinal   INGUINAL HERNIA REPAIR Right 02/23/2018   Procedure: LAPAROSCOPIC INGUINAL HERNIA RECURRENT;  Surgeon: Benjamine Sprague, DO;   Location: [facility];  Service: General;  Laterality: Right;   KNEE SURGERY Right    POLYPECTOMY  03/27/2016   Procedure: POLYPECTOMY;  Surgeon: Lucilla Lame, MD;  Location: [place];  Service: Endoscopy;;   REVERSE SHOULDER ARTHROPLASTY Right 10/06/2017   Procedure: REVERSE SHOULDER ARTHROPLASTY;  Surgeon: Corky Mull, MD;  Location: [facility];  Service: Orthopedics;  Laterality: Right;   SHOULDER ARTHROSCOPY WITH OPEN ROTATOR CUFF REPAIR Left 11/23/2018   Procedure: SHOULDER ARTHROSCOPY WITH DEBRIDEMENT, DECOMPRESSION, AND ROTATOR CUFF REPAIR.;  Surgeon: Corky Mull, MD;  Location: [facility];  Service: Orthopedics;  Laterality: Left;   SURGERY OF LIP      Home Medications:  Allergies as of 08/07/2021       Reactions   Sulfa Antibiotics Rash        Medication List        Accurate as of August 07, 2021 10:51 AM. If you have any questions, ask your nurse or doctor.          CALCIUM 600+D3 PO Take 1 tablet by mouth daily.   dutasteride 0.5 MG capsule Commonly known as: AVODART Take 1 capsule (0.5 mg total) by mouth at bedtime.   multivitamin with minerals Tabs tablet Take 1 tablet by mouth daily. Centrum Silver   Vitamin B-12 5000 MCG Tbdp Take 5,000 mcg by mouth daily.   Vitamin D3 50 MCG (2000 UT) Tabs Take 2,000 Units by mouth daily.        Allergies:  Allergies  Allergen Reactions   Sulfa  Antibiotics Rash    Family History: Family History  Problem Relation Age of Onset   Parkinson's disease Father     Social History:  reports that he has never smoked. He has never used smokeless tobacco. He reports that he does not currently use drugs. He reports that he does not drink alcohol.   Physical Exam: BP (!) 146/78   Pulse 66   Ht '5\' 7"'$  (1.702 m)   Wt 130 lb (59 kg)   BMI 20.36 kg/m   Constitutional:  Alert and oriented, No acute distress. HEENT: [place] AT Respiratory: Normal respiratory effort, no increased work of breathing. Declined DRE  today Neurologic: Grossly intact, no focal deficits, moving all 4 extremities. Psychiatric: Normal mood and affect.   Assessment & Plan:    1.  Elevated PSA We again discussed prostate cancer screening guidelines with recommendations between the ages of 52-69.  He has requested to continue annual screening Request DRE every other year Continue annual follow-up   2.  BPH with LUTS Stable Mild lower urinary tract symptoms Dutasteride refilled    Abbie Sons, MD  [facility] [address] [name],  [address] [phone]

## 2021-09-11 DIAGNOSIS — R1314 Dysphagia, pharyngoesophageal phase: Secondary | ICD-10-CM | POA: Diagnosis not present

## 2021-09-11 DIAGNOSIS — H6123 Impacted cerumen, bilateral: Secondary | ICD-10-CM | POA: Diagnosis not present

## 2021-09-11 DIAGNOSIS — K219 Gastro-esophageal reflux disease without esophagitis: Secondary | ICD-10-CM | POA: Diagnosis not present

## 2021-11-12 DIAGNOSIS — H903 Sensorineural hearing loss, bilateral: Secondary | ICD-10-CM | POA: Diagnosis not present

## 2021-12-23 DIAGNOSIS — Z0000 Encounter for general adult medical examination without abnormal findings: Secondary | ICD-10-CM | POA: Diagnosis not present

## 2021-12-23 DIAGNOSIS — N138 Other obstructive and reflux uropathy: Secondary | ICD-10-CM | POA: Diagnosis not present

## 2021-12-23 DIAGNOSIS — N401 Enlarged prostate with lower urinary tract symptoms: Secondary | ICD-10-CM | POA: Diagnosis not present

## 2021-12-23 DIAGNOSIS — E782 Mixed hyperlipidemia: Secondary | ICD-10-CM | POA: Diagnosis not present

## 2022-01-23 DIAGNOSIS — U071 COVID-19: Secondary | ICD-10-CM | POA: Diagnosis not present

## 2022-01-23 DIAGNOSIS — Z03818 Encounter for observation for suspected exposure to other biological agents ruled out: Secondary | ICD-10-CM | POA: Diagnosis not present

## 2022-02-04 DIAGNOSIS — H9201 Otalgia, right ear: Secondary | ICD-10-CM | POA: Diagnosis not present

## 2022-02-04 DIAGNOSIS — M26621 Arthralgia of right temporomandibular joint: Secondary | ICD-10-CM | POA: Diagnosis not present

## 2022-02-06 ENCOUNTER — Ambulatory Visit: Payer: Medicare PPO | Admitting: Dermatology

## 2022-02-06 VITALS — BP 158/94 | HR 120

## 2022-02-06 DIAGNOSIS — C44319 Basal cell carcinoma of skin of other parts of face: Secondary | ICD-10-CM | POA: Diagnosis not present

## 2022-02-06 DIAGNOSIS — L720 Epidermal cyst: Secondary | ICD-10-CM

## 2022-02-06 DIAGNOSIS — L820 Inflamed seborrheic keratosis: Secondary | ICD-10-CM | POA: Diagnosis not present

## 2022-02-06 DIAGNOSIS — D492 Neoplasm of unspecified behavior of bone, soft tissue, and skin: Secondary | ICD-10-CM

## 2022-02-06 DIAGNOSIS — C4491 Basal cell carcinoma of skin, unspecified: Secondary | ICD-10-CM

## 2022-02-06 DIAGNOSIS — L578 Other skin changes due to chronic exposure to nonionizing radiation: Secondary | ICD-10-CM | POA: Diagnosis not present

## 2022-02-06 HISTORY — DX: Basal cell carcinoma of skin, unspecified: C44.91

## 2022-02-06 NOTE — Progress Notes (Signed)
New Patient Visit  Subjective  Tyler Woods. is a 77 y.o. male who presents for the following: Skin Problem (The patient has spots on his face and arms to be evaluated, some may be new or changing and the patient has concerns that these could be cancer. ).  Wife with patient   The following portions of the chart were reviewed this encounter and updated as appropriate:   Tobacco  Allergies  Meds  Problems  Med Hx  Surg Hx  Fam Hx     Review of Systems:  No other skin or systemic complaints except as noted in HPI or Assessment and Plan.  Objective  Well appearing patient in no apparent distress; mood and affect are within normal limits.  A focused examination was performed including face. Relevant physical exam findings are noted in the Assessment and Plan.  right lat chin below lip 1.1 cm flesh papule      left medial cheek above nasolabial Stuck-on, waxy, tan-brown papule-- Discussed benign etiology and prognosis.   right forearm near elbow 1.1 cm cystic papule    Assessment & Plan  Neoplasm of skin right lat chin below lip Epidermal / dermal shaving  Lesion diameter (cm):  1.1 Informed consent: discussed and consent obtained   Timeout: patient name, date of birth, surgical site, and procedure verified   Procedure prep:  Patient was prepped and draped in usual sterile fashion Prep type:  Isopropyl alcohol Anesthesia: the lesion was anesthetized in a standard fashion   Anesthetic:  1% lidocaine w/ epinephrine 1-100,000 buffered w/ 8.4% NaHCO3 Hemostasis achieved with: pressure, aluminum chloride and electrodesiccation   Outcome: patient tolerated procedure well   Post-procedure details: sterile dressing applied and wound care instructions given   Dressing type: bandage and petrolatum    Destruction of lesion  Destruction method: electrodesiccation and curettage   Informed consent: discussed and consent obtained   Timeout:  patient name, date of  birth, surgical site, and procedure verified Curettage performed in three different directions: Yes   Electrodesiccation performed over the curetted area: Yes   Lesion length (cm):  1.1 Lesion width (cm):  1.1 Margin per side (cm):  0.2 Final wound size (cm):  1.5 Hemostasis achieved with:  pressure, aluminum chloride and electrodesiccation Outcome: patient tolerated procedure well with no complications   Post-procedure details: wound care instructions given    Specimen 1 - Surgical pathology Differential Diagnosis: R/O BCC  Check Margins: No  Inflamed seborrheic keratosis left medial cheek above nasolabial Symptomatic, irritating, patient would like treated.  Destruction of lesion - left medial cheek above nasolabial Complexity: simple   Destruction method: cryotherapy   Informed consent: discussed and consent obtained   Timeout:  patient name, date of birth, surgical site, and procedure verified Lesion destroyed using liquid nitrogen: Yes   Region frozen until ice ball extended beyond lesion: Yes   Outcome: patient tolerated procedure well with no complications   Post-procedure details: wound care instructions given    Epidermal inclusion cyst right forearm near elbow Benign-appearing. Exam most consistent with an epidermal inclusion cyst. Discussed that a cyst is a benign growth that can grow over time and sometimes get irritated or inflamed. Recommend observation if it is not bothersome. Discussed option of surgical excision to remove it if it is growing, symptomatic, or other changes noted. Please call for new or changing lesions so they can be evaluated.   Actinic Damage - chronic, secondary to cumulative UV radiation exposure/sun exposure over  time - diffuse scaly erythematous macules with underlying dyspigmentation - Recommend daily broad spectrum sunscreen SPF 30+ to sun-exposed areas, reapply every 2 hours as needed.  - Recommend staying in the shade or wearing long  sleeves, sun glasses (UVA+UVB protection) and wide brim hats (4-inch brim around the entire circumference of the hat). - Call for new or changing lesions.  Return in about 3 months (around 05/08/2022) for reheck ISK and return for  surgery of a cyst .  I, Marye Round, CMA, am acting as scribe for Sarina Ser, MD .  Documentation: I have reviewed the above documentation for accuracy and completeness, and I agree with the above.  Sarina Ser, MD

## 2022-02-06 NOTE — Patient Instructions (Addendum)
Cryotherapy Aftercare  Wash gently with soap and water everyday.   Apply Vaseline and Band-Aid daily until healed.  Wound Care Instructions  Cleanse wound gently with soap and water once a day then pat dry with clean gauze. Apply a thin coat of Petrolatum (petroleum jelly, "Vaseline") over the wound (unless you have an allergy to this). We recommend that you use a new, sterile tube of Vaseline. Do not pick or remove scabs. Do not remove the yellow or white "healing tissue" from the base of the wound.  Cover the wound with fresh, clean, nonstick gauze and secure with paper tape. You may use Band-Aids in place of gauze and tape if the wound is small enough, but would recommend trimming much of the tape off as there is often too much. Sometimes Band-Aids can irritate the skin.  You should call the office for your biopsy report after 1 week if you have not already been contacted.  If you experience any problems, such as abnormal amounts of bleeding, swelling, significant bruising, significant pain, or evidence of infection, please call the office immediately.  FOR ADULT SURGERY PATIENTS: If you need something for pain relief you may take 1 extra strength Tylenol (acetaminophen) AND 2 Ibuprofen (200mg each) together every 4 hours as needed for pain. (do not take these if you are allergic to them or if you have a reason you should not take them.) Typically, you may only need pain medication for 1 to 3 days.      Due to recent changes in healthcare laws, you may see results of your pathology and/or laboratory studies on MyChart before the doctors have had a chance to review them. We understand that in some cases there may be results that are confusing or concerning to you. Please understand that not all results are received at the same time and often the doctors may need to interpret multiple results in order to provide you with the best plan of care or course of treatment. Therefore, we ask that you  please give us 2 business days to thoroughly review all your results before contacting the office for clarification. Should we see a critical lab result, you will be contacted sooner.   If You Need Anything After Your Visit  If you have any questions or concerns for your doctor, please call our main line at [phone] and press option 4 to reach your doctor's medical assistant. If no one answers, please leave a voicemail as directed and we will return your call as soon as possible. Messages left after 4 pm will be answered the following business day.   You may also send us a message via MyChart. We typically respond to MyChart messages within 1-2 business days.  For prescription refills, please ask your pharmacy to contact our office. Our fax number is [phone].  If you have an urgent issue when the clinic is closed that cannot wait until the next business day, you can page your doctor at the number below.    Please note that while we do our best to be available for urgent issues outside of office hours, we are not available 24/7.   If you have an urgent issue and are unable to reach us, you may choose to seek medical care at your doctor's office, retail clinic, urgent care center, or emergency room.  If you have a medical emergency, please immediately call 911 or go to the emergency department.  Pager Numbers  - Dr. Kowalski: [phone]  -   Dr. Moye: [phone]  - Dr. Stewart: [phone]  In the event of inclement weather, please call our main line at [phone] for an update on the status of any delays or closures.  Dermatology Medication Tips: Please keep the boxes that topical medications come in in order to help keep track of the instructions about where and how to use these. Pharmacies typically print the medication instructions only on the boxes and not directly on the medication tubes.   If your medication is too expensive, please contact our office at  [phone] option 4 or send us a message through MyChart.   We are unable to tell what your co-pay for medications will be in advance as this is different depending on your insurance coverage. However, we may be able to find a substitute medication at lower cost or fill out paperwork to get insurance to cover a needed medication.   If a prior authorization is required to get your medication covered by your insurance company, please allow us 1-2 business days to complete this process.  Drug prices often vary depending on where the prescription is filled and some pharmacies may offer cheaper prices.  The website www.goodrx.com contains coupons for medications through different pharmacies. The prices here do not account for what the cost may be with help from insurance (it may be cheaper with your insurance), but the website can give you the price if you did not use any insurance.  - You can print the associated coupon and take it with your prescription to the pharmacy.  - You may also stop by our office during regular business hours and pick up a GoodRx coupon card.  - If you need your prescription sent electronically to a different pharmacy, notify our office through [facility] MyChart or by phone at [phone] option 4.     Si Usted Necesita Algo Despus de Su Visita  Tambin puede enviarnos un mensaje a travs de MyChart. Por lo general respondemos a los mensajes de MyChart en el transcurso de 1 a 2 das hbiles.  Para renovar recetas, por favor pida a su farmacia que se ponga en contacto con nuestra oficina. Nuestro nmero de fax es el [phone].  Si tiene un asunto urgente cuando la clnica est cerrada y que no puede esperar hasta el siguiente da hbil, puede llamar/localizar a su doctor(a) al nmero que aparece a continuacin.   Por favor, tenga en cuenta que aunque hacemos todo lo posible para estar disponibles para asuntos urgentes fuera del horario de oficina, no estamos  disponibles las 24 horas del da, los 7 das de la semana.   Si tiene un problema urgente y no puede comunicarse con nosotros, puede optar por buscar atencin mdica  en el consultorio de su doctor(a), en una clnica privada, en un centro de atencin urgente o en una sala de emergencias.  Si tiene una emergencia mdica, por favor llame inmediatamente al 911 o vaya a la sala de emergencias.  Nmeros de bper  - Dr. Kowalski: [phone]  - Dra. Moye: [phone]  - Dra. Stewart: [phone]  En caso de inclemencias del tiempo, por favor llame a nuestra lnea principal al [phone] para una actualizacin sobre el estado de cualquier retraso o cierre.  Consejos para la medicacin en dermatologa: Por favor, guarde las cajas en las que vienen los medicamentos de uso tpico para ayudarle a seguir las instrucciones sobre dnde y cmo usarlos. Las farmacias generalmente imprimen las instrucciones del medicamento slo en las cajas y   no directamente en los tubos del medicamento.   Si su medicamento es muy caro, por favor, pngase en contacto con nuestra oficina llamando al [phone] y presione la opcin 4 o envenos un mensaje a travs de MyChart.   No podemos decirle cul ser su copago por los medicamentos por adelantado ya que esto es diferente dependiendo de la cobertura de su seguro. Sin embargo, es posible que podamos encontrar un medicamento sustituto a menor costo o llenar un formulario para que el seguro cubra el medicamento que se considera necesario.   Si se requiere una autorizacin previa para que su compaa de seguros cubra su medicamento, por favor permtanos de 1 a 2 das hbiles para completar este proceso.  Los precios de los medicamentos varan con frecuencia dependiendo del lugar de dnde se surte la receta y alguna farmacias pueden ofrecer precios ms baratos.  El sitio web www.goodrx.com tiene cupones para medicamentos de diferentes farmacias. Los precios aqu no  tienen en cuenta lo que podra costar con la ayuda del seguro (puede ser ms barato con su seguro), pero el sitio web puede darle el precio si no utiliz ningn seguro.  - Puede imprimir el cupn correspondiente y llevarlo con su receta a la farmacia.  - Tambin puede pasar por nuestra oficina durante el horario de atencin regular y recoger una tarjeta de cupones de GoodRx.  - Si necesita que su receta se enve electrnicamente a una farmacia diferente, informe a nuestra oficina a travs de MyChart de  o por telfono llamando al [phone] y presione la opcin 4.  

## 2022-02-07 DIAGNOSIS — H353131 Nonexudative age-related macular degeneration, bilateral, early dry stage: Secondary | ICD-10-CM | POA: Diagnosis not present

## 2022-02-07 DIAGNOSIS — H35372 Puckering of macula, left eye: Secondary | ICD-10-CM | POA: Diagnosis not present

## 2022-02-07 DIAGNOSIS — Z961 Presence of intraocular lens: Secondary | ICD-10-CM | POA: Diagnosis not present

## 2022-02-12 ENCOUNTER — Telehealth: Payer: Self-pay

## 2022-02-12 NOTE — Telephone Encounter (Signed)
-----  Message from Ralene Bathe, MD sent at 02/11/2022  6:50 PM EST ----- Diagnosis Skin , right lat chin below lip BASAL CELL CARCINOMA, NODULAR PATTERN  Cancer - BCC Already treated Recheck next visit

## 2022-02-12 NOTE — Telephone Encounter (Signed)
Advised pt of bx results/sh ?

## 2022-02-14 ENCOUNTER — Encounter: Payer: Self-pay | Admitting: Dermatology

## 2022-03-04 ENCOUNTER — Ambulatory Visit (INDEPENDENT_AMBULATORY_CARE_PROVIDER_SITE_OTHER): Payer: Medicare PPO | Admitting: Dermatology

## 2022-03-04 ENCOUNTER — Telehealth: Payer: Self-pay

## 2022-03-04 ENCOUNTER — Other Ambulatory Visit: Payer: Self-pay | Admitting: Dermatology

## 2022-03-04 VITALS — BP 145/77 | HR 57

## 2022-03-04 DIAGNOSIS — D2361 Other benign neoplasm of skin of right upper limb, including shoulder: Secondary | ICD-10-CM

## 2022-03-04 DIAGNOSIS — D492 Neoplasm of unspecified behavior of bone, soft tissue, and skin: Secondary | ICD-10-CM

## 2022-03-04 MED ORDER — MUPIROCIN 2 % EX OINT: 1.0000 | TOPICAL_OINTMENT | Freq: Every day | CUTANEOUS | 1 refills | Status: AC

## 2022-03-04 NOTE — Patient Instructions (Signed)

## 2022-03-04 NOTE — Telephone Encounter (Signed)
Spoke to patients wife and she said patient is doing fine after today's surgery.Tyler Woods

## 2022-03-04 NOTE — Progress Notes (Signed)
   Follow-Up Visit   Subjective  Tyler Woods. is a 77 y.o. male who presents for the following: Cyst (Vs other of right forearm near elbow - excise today).  The following portions of the chart were reviewed this encounter and updated as appropriate:   Tobacco  Allergies  Meds  Problems  Med Hx  Surg Hx  Fam Hx     Review of Systems:  No other skin or systemic complaints except as noted in HPI or Assessment and Plan.  Objective  Well appearing patient in no apparent distress; mood and affect are within normal limits.  A focused examination was performed including right arm. Relevant physical exam findings are noted in the Assessment and Plan.  Right forearm near elbow Cystic papule 1.4cm   Assessment & Plan  Neoplasm of skin Right forearm near elbow  Skin excision  Lesion length (cm):  1.4 Lesion width (cm):  1.4 Margin per side (cm):  0.2 Total excision diameter (cm):  1.8 Informed consent: discussed and consent obtained   Timeout: patient name, date of birth, surgical site, and procedure verified   Procedure prep:  Patient was prepped and draped in usual sterile fashion Prep type:  Isopropyl alcohol and povidone-iodine Anesthesia: the lesion was anesthetized in a standard fashion   Anesthetic:  1% lidocaine w/ epinephrine 1-100,000 buffered w/ 8.4% NaHCO3 (3cc lido w/ epi, 3cc bupivicaine, Total of 6cc) Instrument used: #15 blade   Hemostasis achieved with: pressure   Hemostasis achieved with comment:  Electrocautery Outcome: patient tolerated procedure well with no complications   Post-procedure details: sterile dressing applied and wound care instructions given   Dressing type: bandage, pressure dressing and bacitracin (Mupirocin)    Skin repair Complexity:  Complex Final length (cm):  1.5 Reason for type of repair: reduce tension to allow closure, reduce the risk of dehiscence, infection, and necrosis, reduce subcutaneous dead space and avoid a  hematoma, allow closure of the large defect, preserve normal anatomy, preserve normal anatomical and functional relationships and enhance both functionality and cosmetic results   Undermining: area extensively undermined   Undermining comment:  Undermining Defect 1.8cm Subcutaneous layers (deep stitches):  Suture size:  3-0 Suture type: Vicryl (polyglactin 910)   Subcutaneous suture technique: Inverted Dermal. Fine/surface layer approximation (top stitches):  Suture size:  3-0 Suture type: nylon   Stitches: horizontal mattress   Suture removal (days):  7 Hemostasis achieved with: pressure Outcome: patient tolerated procedure well with no complications   Post-procedure details: sterile dressing applied and wound care instructions given   Dressing type: bandage, pressure dressing and bacitracin (Mupirocin)    mupirocin ointment (BACTROBAN) 2 % Apply 1 Application topically daily. Qd to excision site  Specimen 1 - Surgical pathology Differential Diagnosis: Cyst vs other  Check Margins: No  Cyst vs other, excised today Start Mupirocin oint qd to excision site   Return in about 1 week (around 03/11/2022) for suture removal.  I, Othelia Pulling, RMA, am acting as scribe for Sarina Ser, MD . Documentation: I have reviewed the above documentation for accuracy and completeness, and I agree with the above.  Sarina Ser, MD

## 2022-03-09 ENCOUNTER — Encounter: Payer: Self-pay | Admitting: Dermatology

## 2022-03-11 ENCOUNTER — Ambulatory Visit (INDEPENDENT_AMBULATORY_CARE_PROVIDER_SITE_OTHER): Payer: Medicare PPO | Admitting: Dermatology

## 2022-03-11 DIAGNOSIS — D2361 Other benign neoplasm of skin of right upper limb, including shoulder: Secondary | ICD-10-CM

## 2022-03-11 DIAGNOSIS — D239 Other benign neoplasm of skin, unspecified: Secondary | ICD-10-CM

## 2022-03-11 DIAGNOSIS — Z4802 Encounter for removal of sutures: Secondary | ICD-10-CM

## 2022-03-11 NOTE — Progress Notes (Signed)
   Follow-Up Visit   Subjective  Tyler Woods. is a 77 y.o. 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  The following portions of the chart were reviewed this encounter and updated as appropriate:   Tobacco  Allergies  Meds  Problems  Med Hx  Surg Hx  Fam Hx     Review of Systems:  No other skin or systemic complaints except as noted in HPI or Assessment and Plan.  Objective  Well appearing patient in no apparent distress; mood and affect are within normal limits.  A focused examination was performed including the face and R arm. Relevant physical exam findings are noted in the Assessment and Plan.  R forearm near elbow Healing excision site.   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  - Discussed pathology results showing a benign hidrocystoma.  - Patient advised to keep steri-strips dry until they fall off. - Scars remodel for a full year. - Once steri-strips fall off, patient can apply over-the-counter silicone scar cream each night to help with scar remodeling if desired. - Patient advised to call with any concerns or if they notice any new or changing lesions.    Return for appointment as scheduled.  Luther Redo, CMA, am acting as scribe for Sarina Ser, MD . Documentation: I have reviewed the above documentation for accuracy and completeness, and I agree with the above.  Sarina Ser, MD

## 2022-03-11 NOTE — Patient Instructions (Signed)
Due to recent changes in healthcare laws, you may see results of your pathology and/or laboratory studies on MyChart before the doctors have had a chance to review them. We understand that in some cases there may be results that are confusing or concerning to you. Please understand that not all results are received at the same time and often the doctors may need to interpret multiple results in order to provide you with the best plan of care or course of treatment. Therefore, we ask that you please give us 2 business days to thoroughly review all your results before contacting the office for clarification. Should we see a critical lab result, you will be contacted sooner.   If You Need Anything After Your Visit  If you have any questions or concerns for your doctor, please call our main line at [phone] and press option 4 to reach your doctor's medical assistant. If no one answers, please leave a voicemail as directed and we will return your call as soon as possible. Messages left after 4 pm will be answered the following business day.   You may also send us a message via MyChart. We typically respond to MyChart messages within 1-2 business days.  For prescription refills, please ask your pharmacy to contact our office. Our fax number is [phone].  If you have an urgent issue when the clinic is closed that cannot wait until the next business day, you can page your doctor at the number below.    Please note that while we do our best to be available for urgent issues outside of office hours, we are not available 24/7.   If you have an urgent issue and are unable to reach us, you may choose to seek medical care at your doctor's office, retail clinic, urgent care center, or emergency room.  If you have a medical emergency, please immediately call 911 or go to the emergency department.  Pager Numbers  - Dr. Kowalski: [phone]  - Dr. Moye: [phone]  - Dr. Stewart:  [phone]  In the event of inclement weather, please call our main line at [phone] for an update on the status of any delays or closures.  Dermatology Medication Tips: Please keep the boxes that topical medications come in in order to help keep track of the instructions about where and how to use these. Pharmacies typically print the medication instructions only on the boxes and not directly on the medication tubes.   If your medication is too expensive, please contact our office at [phone] option 4 or send us a message through MyChart.   We are unable to tell what your co-pay for medications will be in advance as this is different depending on your insurance coverage. However, we may be able to find a substitute medication at lower cost or fill out paperwork to get insurance to cover a needed medication.   If a prior authorization is required to get your medication covered by your insurance company, please allow us 1-2 business days to complete this process.  Drug prices often vary depending on where the prescription is filled and some pharmacies may offer cheaper prices.  The website www.goodrx.com contains coupons for medications through different pharmacies. The prices here do not account for what the cost may be with help from insurance (it may be cheaper with your insurance), but the website can give you the price if you did not use any insurance.  - You can print the associated coupon and take it with   your prescription to the pharmacy.  - You may also stop by our office during regular business hours and pick up a GoodRx coupon card.  - If you need your prescription sent electronically to a different pharmacy, notify our office through [facility] MyChart or by phone at [phone] option 4.     Si Usted Necesita Algo Despus de Su Visita  Tambin puede enviarnos un mensaje a travs de MyChart. Por lo general respondemos a los mensajes de MyChart en el transcurso de 1 a 2  das hbiles.  Para renovar recetas, por favor pida a su farmacia que se ponga en contacto con nuestra oficina. Nuestro nmero de fax es el [phone].  Si tiene un asunto urgente cuando la clnica est cerrada y que no puede esperar hasta el siguiente da hbil, puede llamar/localizar a su doctor(a) al nmero que aparece a continuacin.   Por favor, tenga en cuenta que aunque hacemos todo lo posible para estar disponibles para asuntos urgentes fuera del horario de oficina, no estamos disponibles las 24 horas del da, los 7 das de la semana.   Si tiene un problema urgente y no puede comunicarse con nosotros, puede optar por buscar atencin mdica  en el consultorio de su doctor(a), en una clnica privada, en un centro de atencin urgente o en una sala de emergencias.  Si tiene una emergencia mdica, por favor llame inmediatamente al 911 o vaya a la sala de emergencias.  Nmeros de bper  - Dr. Kowalski: [phone]  - Dra. Moye: [phone]  - Dra. Stewart: [phone]  En caso de inclemencias del tiempo, por favor llame a nuestra lnea principal al [phone] para una actualizacin sobre el estado de cualquier retraso o cierre.  Consejos para la medicacin en dermatologa: Por favor, guarde las cajas en las que vienen los medicamentos de uso tpico para ayudarle a seguir las instrucciones sobre dnde y cmo usarlos. Las farmacias generalmente imprimen las instrucciones del medicamento slo en las cajas y no directamente en los tubos del medicamento.   Si su medicamento es muy caro, por favor, pngase en contacto con nuestra oficina llamando al [phone] y presione la opcin 4 o envenos un mensaje a travs de MyChart.   No podemos decirle cul ser su copago por los medicamentos por adelantado ya que esto es diferente dependiendo de la cobertura de su seguro. Sin embargo, es posible que podamos encontrar un medicamento sustituto a menor costo o llenar un formulario para que el  seguro cubra el medicamento que se considera necesario.   Si se requiere una autorizacin previa para que su compaa de seguros cubra su medicamento, por favor permtanos de 1 a 2 das hbiles para completar este proceso.  Los precios de los medicamentos varan con frecuencia dependiendo del lugar de dnde se surte la receta y alguna farmacias pueden ofrecer precios ms baratos.  El sitio web www.goodrx.com tiene cupones para medicamentos de diferentes farmacias. Los precios aqu no tienen en cuenta lo que podra costar con la ayuda del seguro (puede ser ms barato con su seguro), pero el sitio web puede darle el precio si no utiliz ningn seguro.  - Puede imprimir el cupn correspondiente y llevarlo con su receta a la farmacia.  - Tambin puede pasar por nuestra oficina durante el horario de atencin regular y recoger una tarjeta de cupones de GoodRx.  - Si necesita que su receta se enve electrnicamente a una farmacia diferente, informe a nuestra oficina a travs de MyChart de [facility]   o por telfono llamando al [phone] y presione la opcin 4.  

## 2022-03-18 ENCOUNTER — Encounter: Payer: Self-pay | Admitting: Dermatology

## 2022-04-21 ENCOUNTER — Ambulatory Visit: Payer: Medicare PPO | Admitting: Dermatology

## 2022-04-21 ENCOUNTER — Encounter: Payer: Self-pay | Admitting: Dermatology

## 2022-04-21 VITALS — BP 127/79 | HR 70

## 2022-04-21 DIAGNOSIS — Z86018 Personal history of other benign neoplasm: Secondary | ICD-10-CM | POA: Diagnosis not present

## 2022-04-21 DIAGNOSIS — L820 Inflamed seborrheic keratosis: Secondary | ICD-10-CM | POA: Diagnosis not present

## 2022-04-21 DIAGNOSIS — L821 Other seborrheic keratosis: Secondary | ICD-10-CM

## 2022-04-21 DIAGNOSIS — L578 Other skin changes due to chronic exposure to nonionizing radiation: Secondary | ICD-10-CM

## 2022-04-21 DIAGNOSIS — Z85828 Personal history of other malignant neoplasm of skin: Secondary | ICD-10-CM | POA: Diagnosis not present

## 2022-04-21 NOTE — Patient Instructions (Addendum)
Seborrheic Keratosis  What causes seborrheic keratoses? Seborrheic keratoses are harmless, common skin growths that first appear during adult life.  As time goes by, more growths appear.  Some people may develop a large number of them.  Seborrheic keratoses appear on both covered and uncovered body parts.  They are not caused by sunlight.  The tendency to develop seborrheic keratoses can be inherited.  They vary in color from skin-colored to gray, brown, or even black.  They can be either smooth or have a rough, warty surface.   Seborrheic keratoses are superficial and look as if they were stuck on the skin.  Under the microscope this type of keratosis looks like layers upon layers of skin.  That is why at times the top layer may seem to fall off, but the rest of the growth remains and re-grows.    Treatment Seborrheic keratoses do not need to be treated, but can easily be removed in the office.  Seborrheic keratoses often cause symptoms when they rub on clothing or jewelry.  Lesions can be in the way of shaving.  If they become inflamed, they can cause itching, soreness, or burning.  Removal of a seborrheic keratosis can be accomplished by freezing, burning, or surgery. If any spot bleeds, scabs, or grows rapidly, please return to have it checked, as these can be an indication of a skin cancer.  Cryotherapy Aftercare  Wash gently with soap and water everyday.   Apply Vaseline and Band-Aid daily until healed.    Melanoma ABCDEs  Melanoma is the most dangerous type of skin cancer, and is the leading cause of death from skin disease.  You are more likely to develop melanoma if you: Have light-colored skin, light-colored eyes, or red or blond hair Spend a lot of time in the sun Tan regularly, either outdoors or in a tanning bed Have had blistering sunburns, especially during childhood Have a close family member who has had a melanoma Have atypical moles or large birthmarks  Early detection of  melanoma is key since treatment is typically straightforward and cure rates are extremely high if we catch it early.   The first sign of melanoma is often a change in a mole or a new dark spot.  The ABCDE system is a way of remembering the signs of melanoma.  A for asymmetry:  The two halves do not match. B for border:  The edges of the growth are irregular. C for color:  A mixture of colors are present instead of an even brown color. D for diameter:  Melanomas are usually (but not always) greater than 6mm - the size of a pencil eraser. E for evolution:  The spot keeps changing in size, shape, and color.  Please check your skin once per month between visits. You can use a small mirror in front and a large mirror behind you to keep an eye on the back side or your body.   If you see any new or changing lesions before your next follow-up, please call to schedule a visit.  Please continue daily skin protection including broad spectrum sunscreen SPF 30+ to sun-exposed areas, reapplying every 2 hours as needed when you're outdoors.   Staying in the shade or wearing long sleeves, sun glasses (UVA+UVB protection) and wide brim hats (4-inch brim around the entire circumference of the hat) are also recommended for sun protection.    Due to recent changes in healthcare laws, you may see results of your pathology and/or laboratory   studies on MyChart before the doctors have had a chance to review them. We understand that in some cases there may be results that are confusing or concerning to you. Please understand that not all results are received at the same time and often the doctors may need to interpret multiple results in order to provide you with the best plan of care or course of treatment. Therefore, we ask that you please give us 2 business days to thoroughly review all your results before contacting the office for clarification. Should we see a critical lab result, you will be contacted sooner.   If  You Need Anything After Your Visit  If you have any questions or concerns for your doctor, please call our main line at [phone] and press option 4 to reach your doctor's medical assistant. If no one answers, please leave a voicemail as directed and we will return your call as soon as possible. Messages left after 4 pm will be answered the following business day.   You may also send us a message via MyChart. We typically respond to MyChart messages within 1-2 business days.  For prescription refills, please ask your pharmacy to contact our office. Our fax number is [phone].  If you have an urgent issue when the clinic is closed that cannot wait until the next business day, you can page your doctor at the number below.    Please note that while we do our best to be available for urgent issues outside of office hours, we are not available 24/7.   If you have an urgent issue and are unable to reach us, you may choose to seek medical care at your doctor's office, retail clinic, urgent care center, or emergency room.  If you have a medical emergency, please immediately call 911 or go to the emergency department.  Pager Numbers  - Dr. Kowalski: [phone]  - Dr. Moye: [phone]  - Dr. Stewart: [phone]  In the event of inclement weather, please call our main line at [phone] for an update on the status of any delays or closures.  Dermatology Medication Tips: Please keep the boxes that topical medications come in in order to help keep track of the instructions about where and how to use these. Pharmacies typically print the medication instructions only on the boxes and not directly on the medication tubes.   If your medication is too expensive, please contact our office at [phone] option 4 or send us a message through MyChart.   We are unable to tell what your co-pay for medications will be in advance as this is different depending on your insurance coverage.  However, we may be able to find a substitute medication at lower cost or fill out paperwork to get insurance to cover a needed medication.   If a prior authorization is required to get your medication covered by your insurance company, please allow us 1-2 business days to complete this process.  Drug prices often vary depending on where the prescription is filled and some pharmacies may offer cheaper prices.  The website www.goodrx.com contains coupons for medications through different pharmacies. The prices here do not account for what the cost may be with help from insurance (it may be cheaper with your insurance), but the website can give you the price if you did not use any insurance.  - You can print the associated coupon and take it with your prescription to the pharmacy.  - You may also stop by our office during   regular business hours and pick up a GoodRx coupon card.  - If you need your prescription sent electronically to a different pharmacy, notify our office through [facility] MyChart or by phone at [phone] option 4.     Si Usted Necesita Algo Despus de Su Visita  Tambin puede enviarnos un mensaje a travs de MyChart. Por lo general respondemos a los mensajes de MyChart en el transcurso de 1 a 2 das hbiles.  Para renovar recetas, por favor pida a su farmacia que se ponga en contacto con nuestra oficina. Nuestro nmero de fax es el [phone].  Si tiene un asunto urgente cuando la clnica est cerrada y que no puede esperar hasta el siguiente da hbil, puede llamar/localizar a su doctor(a) al nmero que aparece a continuacin.   Por favor, tenga en cuenta que aunque hacemos todo lo posible para estar disponibles para asuntos urgentes fuera del horario de oficina, no estamos disponibles las 24 horas del da, los 7 das de la semana.   Si tiene un problema urgente y no puede comunicarse con nosotros, puede optar por buscar atencin mdica  en el consultorio de su  doctor(a), en una clnica privada, en un centro de atencin urgente o en una sala de emergencias.  Si tiene una emergencia mdica, por favor llame inmediatamente al 911 o vaya a la sala de emergencias.  Nmeros de bper  - Dr. Kowalski: [phone]  - Dra. Moye: [phone]  - Dra. Stewart: [phone]  En caso de inclemencias del tiempo, por favor llame a nuestra lnea principal al [phone] para una actualizacin sobre el estado de cualquier retraso o cierre.  Consejos para la medicacin en dermatologa: Por favor, guarde las cajas en las que vienen los medicamentos de uso tpico para ayudarle a seguir las instrucciones sobre dnde y cmo usarlos. Las farmacias generalmente imprimen las instrucciones del medicamento slo en las cajas y no directamente en los tubos del medicamento.   Si su medicamento es muy caro, por favor, pngase en contacto con nuestra oficina llamando al [phone] y presione la opcin 4 o envenos un mensaje a travs de MyChart.   No podemos decirle cul ser su copago por los medicamentos por adelantado ya que esto es diferente dependiendo de la cobertura de su seguro. Sin embargo, es posible que podamos encontrar un medicamento sustituto a menor costo o llenar un formulario para que el seguro cubra el medicamento que se considera necesario.   Si se requiere una autorizacin previa para que su compaa de seguros cubra su medicamento, por favor permtanos de 1 a 2 das hbiles para completar este proceso.  Los precios de los medicamentos varan con frecuencia dependiendo del lugar de dnde se surte la receta y alguna farmacias pueden ofrecer precios ms baratos.  El sitio web www.goodrx.com tiene cupones para medicamentos de diferentes farmacias. Los precios aqu no tienen en cuenta lo que podra costar con la ayuda del seguro (puede ser ms barato con su seguro), pero el sitio web puede darle el precio si no utiliz ningn seguro.  - Puede imprimir el cupn  correspondiente y llevarlo con su receta a la farmacia.  - Tambin puede pasar por nuestra oficina durante el horario de atencin regular y recoger una tarjeta de cupones de GoodRx.  - Si necesita que su receta se enve electrnicamente a una farmacia diferente, informe a nuestra oficina a travs de MyChart de [facility] o por telfono llamando al [phone] y presione la opcin 4.   

## 2022-04-21 NOTE — Progress Notes (Signed)
   Follow-Up Visit   Subjective  Tyler Woods. is a 77 y.o. 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 Hx of bcc at right chin below lip bx proven recheck.   The following portions of the chart were reviewed this encounter and updated as appropriate: medications, allergies, medical history  Review of Systems:  No other skin or systemic complaints except as noted in HPI or Assessment and Plan.  Objective  Well appearing patient in no apparent distress; mood and affect are within normal limits. A focused examination was performed of the following areas: Left cheek  Relevant exam findings are noted in the Assessment and Plan.   Assessment & Plan   INFLAMED SEBORRHEIC KERATOSIS Exam: Erythematous keratotic or waxy stuck-on papule or plaque.  Symptomatic, irritating, patient would like treated.  Benign-appearing.  Call clinic for new or changing lesions.   Prior to procedure, discussed risks of blister formation, small wound, skin dyspigmentation, or rare scar following treatment. Recommend Vaseline ointment to treated areas while healing.  Destruction Procedure Note Destruction method: cryotherapy   Informed consent: discussed and consent obtained   Lesion destroyed using liquid nitrogen: Yes   Outcome: patient tolerated procedure well with no complications   Post-procedure details: wound care instructions given   Locations: right cheek x 2 # of Lesions Treated: 2  SEBORRHEIC KERATOSIS - Stuck-on, waxy, tan-brown papules and/or plaques  - Benign-appearing - Discussed benign etiology and prognosis. - Observe - Call for any changes  ACTINIC DAMAGE - chronic, secondary to cumulative UV radiation exposure/sun exposure over time - diffuse scaly erythematous macules with underlying dyspigmentation - Recommend daily broad spectrum sunscreen SPF 30+ to sun-exposed areas, reapply every  2 hours as needed.  - Recommend staying in the shade or wearing long sleeves, sun glasses (UVA+UVB protection) and wide brim hats (4-inch brim around the entire circumference of the hat). - Call for new or changing lesions.  HISTORY OF BASAL CELL CARCINOMA OF THE SKIN Right lat chin below lip treated with [facility] 02/06/22 - No evidence of recurrence today - Recommend regular full body skin exams - Recommend daily broad spectrum sunscreen SPF 30+ to sun-exposed areas, reapply every 2 hours as needed.  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   Return in about 1 year (around 04/21/2023) for TBSE.  IAsher Muir, CMA, am acting as scribe for Armida Sans, MD.  Documentation: I have reviewed the above documentation for accuracy and completeness, and I agree with the above.  Armida Sans, MD

## 2022-05-01 ENCOUNTER — Ambulatory Visit: Payer: Medicare PPO | Admitting: Dermatology

## 2022-05-05 ENCOUNTER — Encounter: Payer: Self-pay | Admitting: Dermatology

## 2022-06-18 DIAGNOSIS — H903 Sensorineural hearing loss, bilateral: Secondary | ICD-10-CM | POA: Diagnosis not present

## 2022-07-01 DIAGNOSIS — E782 Mixed hyperlipidemia: Secondary | ICD-10-CM | POA: Diagnosis not present

## 2022-07-01 DIAGNOSIS — N138 Other obstructive and reflux uropathy: Secondary | ICD-10-CM | POA: Diagnosis not present

## 2022-07-01 DIAGNOSIS — Z0000 Encounter for general adult medical examination without abnormal findings: Secondary | ICD-10-CM | POA: Diagnosis not present

## 2022-07-01 DIAGNOSIS — N401 Enlarged prostate with lower urinary tract symptoms: Secondary | ICD-10-CM | POA: Diagnosis not present

## 2022-07-01 DIAGNOSIS — Z1331 Encounter for screening for depression: Secondary | ICD-10-CM | POA: Diagnosis not present

## 2022-07-08 ENCOUNTER — Encounter: Payer: Self-pay | Admitting: Emergency Medicine

## 2022-07-08 ENCOUNTER — Ambulatory Visit
Admission: EM | Admit: 2022-07-08 | Discharge: 2022-07-08 | Disposition: A | Discharge status: Home / Self Care | Payer: Medicare PPO | Attending: Emergency Medicine | Admitting: Emergency Medicine

## 2022-07-08 DIAGNOSIS — S0101XA Laceration without foreign body of scalp, initial encounter: Secondary | ICD-10-CM | POA: Diagnosis not present

## 2022-07-08 MED ORDER — CEPHALEXIN 500 MG PO CAPS: 500.0000 mg | ORAL_CAPSULE | Freq: Three times a day (TID) | ORAL | 0 refills | Status: AC

## 2022-07-08 MED ORDER — TETANUS-DIPHTH-ACELL PERTUSSIS 5-2.5-18.5 LF-MCG/0.5 IM SUSY
0.5000 mL | PREFILLED_SYRINGE | Freq: Once | INTRAMUSCULAR | Status: AC
  Administered 2022-07-08: 0.5 mL via INTRAMUSCULAR

## 2022-07-08 NOTE — ED Provider Notes (Signed)
[facility]    CSN: [id number] Arrival date & time: 07/08/22  1026      History   Chief Complaint Chief Complaint  Patient presents with   Head Laceration    HPI Tyler Garman. is a 77 y.o. male.   HPI  77 year old male with a past medical history significant for kidney stones, DVT, hearing loss, headaches, basal cell carcinoma, and arthritis presents for evaluation of a scalp laceration.  He reports that half of a brick fell from approximately 4 feet air and struck him on the crown of his head.  He did have significant bleeding but the bleeding is stopped at this time.  He did not have a loss of consciousness, he denies changes in vision, and he denies nausea or vomiting.  He can recount the history of the events of the injury without issue.  His wife is at bedside.  Patient is unsure when his last tetanus shot was.  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   Hx of colonic polyps    Benign neoplasm of transverse colon    Benign neoplasm of ascending colon    Polyp of sigmoid colon    Impingement syndrome of left shoulder 09/28/2013   Left shoulder pain 09/28/2013   Benign prostatic hyperplasia with urinary obstruction 04/18/2012   BPH with obstruction/lower urinary tract symptoms 04/18/2012   Abnormal prostate specific antigen 04/18/2012   Elevated prostate specific antigen (PSA) 04/18/2012    Past Surgical History:  Procedure Laterality Date   CATARACT EXTRACTION W/PHACO Right 12/05/2020   Procedure: CATARACT EXTRACTION  PHACO AND INTRAOCULAR LENS PLACEMENT (IOC) RIGHT;  Surgeon: Lockie Mola, MD;  Location: [facility];  Service: Ophthalmology;  Laterality: Right;  17.32 01:45.1   CATARACT EXTRACTION W/PHACO Left 12/19/2020   Procedure: CATARACT EXTRACTION PHACO AND INTRAOCULAR LENS PLACEMENT (IOC) LEFT 7.40 00:58.8;  Surgeon: Lockie Mola, MD;  Location: [facility];  Service: Ophthalmology;  Laterality: Left;   COLONOSCOPY     COLONOSCOPY WITH PROPOFOL N/A 03/27/2016   Procedure: COLONOSCOPY WITH PROPOFOL;  Surgeon: Midge Minium, MD;  Location: [facility];  Service: Endoscopy;  Laterality: N/A;   FOOT SURGERY Right    HERNIA REPAIR Right    inguinal   INGUINAL HERNIA REPAIR Right 02/23/2018   Procedure: LAPAROSCOPIC INGUINAL HERNIA RECURRENT;  Surgeon: Sung Amabile, DO;  Location: [facility];  Service: General;  Laterality: Right;   KNEE SURGERY Right    POLYPECTOMY  03/27/2016   Procedure: POLYPECTOMY;  Surgeon: Midge Minium, MD;  Location: [facility];  Service: Endoscopy;;   REVERSE SHOULDER ARTHROPLASTY Right 10/06/2017   Procedure: REVERSE SHOULDER ARTHROPLASTY;  Surgeon: Christena Flake, MD;  Location: [facility];  Service: Orthopedics;  Laterality: Right;   SHOULDER ARTHROSCOPY WITH OPEN ROTATOR CUFF REPAIR Left 11/23/2018   Procedure: SHOULDER ARTHROSCOPY WITH DEBRIDEMENT, DECOMPRESSION, AND ROTATOR CUFF REPAIR.;  Surgeon: Christena Flake, MD;  Location: [facility];  Service: Orthopedics;  Laterality: Left;   SURGERY OF LIP         Home Medications    Prior to Admission medications   Medication Sig Start Date End Date Taking? Authorizing Provider  cephALEXin (KEFLEX) 500 MG capsule Take 1 capsule (500 mg total) by mouth 3 (three) times daily for 5 days. 07/08/22 07/13/22 Yes Becky Augusta, NP  Calcium Carb-Cholecalciferol (CALCIUM 600+D3 PO) Take 1 tablet by mouth daily.    [provider]  Cholecalciferol (VITAMIN D3) 2000 units TABS Take 2,000 Units by  mouth daily.    [provider]  Cyanocobalamin (VITAMIN B-12) 5000 MCG TBDP Take 5,000 mcg by mouth daily.    [provider]  dutasteride (AVODART) 0.5 MG capsule Take 1 capsule (0.5 mg total) by mouth at bedtime. 08/07/21   Stoioff, Verna Czech, MD  Multiple Vitamin (MULTIVITAMIN WITH MINERALS) TABS tablet Take 1 tablet by mouth daily. Centrum Silver    [provider]  mupirocin ointment (BACTROBAN) 2 % Apply 1 Application topically daily. Qd to excision site 03/04/22   Deirdre Evener, MD    Family History Family History  Problem Relation Age of Onset   Parkinson's disease Father     Social History Social History   Tobacco Use   Smoking status: Never   Smokeless tobacco: Never  Vaping Use   Vaping Use: Never used  Substance Use Topics   Alcohol use: No   Drug use: Not Currently     Allergies   Sulfa antibiotics   Review of Systems Review of Systems  Skin:  Positive for wound. Negative for color change.  Neurological:  Negative for dizziness, syncope and headaches.     Physical Exam Triage Vital Signs ED Triage Vitals [07/08/22 1132]  Enc Vitals Group     BP      Pulse      Resp      Temp      Temp src      SpO2      Weight      Height      Head Circumference      Peak Flow      Pain Score 6     Pain Loc      Pain Edu?      Excl. in GC?    No data found.  Updated Vital Signs BP (!) 143/88 (BP Location: Right Arm)   Pulse (!) 54   Temp 98.1 F (36.7 C) (Oral)   Resp 16   SpO2 99%   Visual Acuity Right Eye Distance:   Left Eye Distance:   Bilateral Distance:    Right Eye Near:   Left Eye Near:    Bilateral Near:     Physical Exam Vitals and nursing note reviewed.  Constitutional:      Appearance: Normal appearance. He is not ill-appearing.  HENT:     Head: Normocephalic and atraumatic.  Skin:    General: Skin is warm and dry.     Capillary Refill: Capillary refill takes less than 2 seconds.     Findings:  Erythema present.  Neurological:     General: No focal deficit present.     Mental Status: He is alert and oriented to person, place, and time.      UC Treatments / Results  Labs (all labs ordered are listed, but only abnormal results are displayed) Labs Reviewed - No data to display  EKG   Radiology No results found.  Procedures Procedures (including critical care time)  Medications Ordered in UC Medications  Tdap (BOOSTRIX) injection 0.5 mL (has no administration in time range)    Initial Impression / Assessment and Plan / UC Course  I have reviewed the triage vital signs and the nursing notes.  Pertinent labs & imaging results that were available during my care of the patient were reviewed by me and considered in my medical decision making (see chart for details).   The patient is a pleasant, nontoxic-appearing 77 year old male present for evaluation of a stellate laceration to the crown of his scalp that happened earlier this morning when he was struck in the head by a brick.  As you can see in image above there is a stellate lack.  The leg that goes to the right side of the scalp is more of a skin tear but the central leg does come open slightly.  There is no apparent debris in the wound.  I will clean the room with chlorhexidine and saline and closed with 2 staples.  We will also bit the patient's tetanus shot.  I will discharge him home on Keflex 500 mg 3 times a day for 5 days to prevent infection.  Scalp was cleansed with chlorhexidine and saline, rinsed with saline, and 2 staples were applied to the stellate lack to close it.  Patient tolerated procedure well.  Final Clinical Impressions(s) / UC Diagnoses   Final diagnoses:  Laceration of scalp, initial encounter     Discharge Instructions      Your scalp laceration was closed with 2 staples today.  The staples remain in place for 5 days.  After that return to the urgent care and we will remove them.  You  may wash your hair normally but avoid vigorously scrubbing over where the staples are in place to avoid tearing them out.  You may take over-the-counter Tylenol and/or ibuprofen according to package instructions as needed for any pain you may have.  Take the Keflex 500 mg 3 times a day for 5 days to prevent infection.  We have updated your tetanus shot today.  If you develop any severe headache, change in vision, change in behavior, or forceful nausea and vomiting you need to go to the ER for evaluation.     ED Prescriptions     Medication Sig Dispense Auth. Provider   cephALEXin (KEFLEX) 500 MG capsule Take 1 capsule (500 mg total) by mouth 3 (three) times daily for 5 days. 15 capsule Becky Augusta, NP      PDMP not reviewed this encounter.   Becky Augusta, NP 07/08/22 1155

## 2022-07-08 NOTE — Discharge Instructions (Addendum)
Your scalp laceration was closed with 2 staples today.  The staples remain in place for 5 days.  After that return to the urgent care and we will remove them.  You may wash your hair normally but avoid vigorously scrubbing over where the staples are in place to avoid tearing them out.  You may take over-the-counter Tylenol and/or ibuprofen according to package instructions as needed for any pain you may have.  Take the Keflex 500 mg 3 times a day for 5 days to prevent infection.  We have updated your tetanus shot today.  If you develop any severe headache, change in vision, change in behavior, or forceful nausea and vomiting you need to go to the ER for evaluation.

## 2022-07-08 NOTE — ED Triage Notes (Signed)
Pt presents with a laceration to the top of his head. He was working on his deck and a brick fell on his head. Pt reports his head is sore, but denies loss of consciousness.

## 2022-07-13 ENCOUNTER — Ambulatory Visit
Admission: EM | Admit: 2022-07-13 | Discharge: 2022-07-13 | Disposition: A | Discharge status: Home / Self Care | Payer: Medicare PPO | Attending: Family Medicine | Admitting: Family Medicine

## 2022-07-13 DIAGNOSIS — Z4802 Encounter for removal of sutures: Secondary | ICD-10-CM

## 2022-07-13 NOTE — ED Triage Notes (Signed)
Patient seen 6/25 for a laceration to the top of head.  At that visit 2 staples applied to top of head.  Patient instructed to return today to have the staples removed.  Site has dried blood 2 staples are visible and easily accessible for removal.

## 2022-07-21 ENCOUNTER — Other Ambulatory Visit: Payer: Self-pay | Admitting: Urology

## 2022-07-23 ENCOUNTER — Ambulatory Visit
Admission: RE | Admit: 2022-07-23 | Discharge: 2022-07-23 | Disposition: A | Discharge status: Home / Self Care | Payer: Medicare PPO | Source: Ambulatory Visit | Attending: Family Medicine | Admitting: Family Medicine

## 2022-07-23 ENCOUNTER — Other Ambulatory Visit: Payer: Self-pay | Admitting: Family Medicine

## 2022-07-23 DIAGNOSIS — M7989 Other specified soft tissue disorders: Secondary | ICD-10-CM | POA: Insufficient documentation

## 2022-07-23 DIAGNOSIS — R600 Localized edema: Secondary | ICD-10-CM | POA: Diagnosis not present

## 2022-07-24 DIAGNOSIS — M25552 Pain in left hip: Secondary | ICD-10-CM | POA: Diagnosis not present

## 2022-08-01 DIAGNOSIS — G9389 Other specified disorders of brain: Secondary | ICD-10-CM | POA: Diagnosis not present

## 2022-08-01 DIAGNOSIS — M79605 Pain in left leg: Secondary | ICD-10-CM | POA: Diagnosis not present

## 2022-08-01 DIAGNOSIS — M4807 Spinal stenosis, lumbosacral region: Secondary | ICD-10-CM | POA: Diagnosis not present

## 2022-08-01 DIAGNOSIS — M5442 Lumbago with sciatica, left side: Secondary | ICD-10-CM | POA: Diagnosis not present

## 2022-08-01 DIAGNOSIS — R609 Edema, unspecified: Secondary | ICD-10-CM | POA: Diagnosis not present

## 2022-08-01 DIAGNOSIS — M25552 Pain in left hip: Secondary | ICD-10-CM | POA: Diagnosis not present

## 2022-08-01 DIAGNOSIS — M5186 Other intervertebral disc disorders, lumbar region: Secondary | ICD-10-CM | POA: Diagnosis not present

## 2022-08-01 DIAGNOSIS — M7989 Other specified soft tissue disorders: Secondary | ICD-10-CM | POA: Diagnosis not present

## 2022-08-01 DIAGNOSIS — M545 Low back pain, unspecified: Secondary | ICD-10-CM | POA: Diagnosis not present

## 2022-08-01 DIAGNOSIS — M47816 Spondylosis without myelopathy or radiculopathy, lumbar region: Secondary | ICD-10-CM | POA: Diagnosis not present

## 2022-08-05 ENCOUNTER — Other Ambulatory Visit: Payer: Medicare PPO

## 2022-08-05 ENCOUNTER — Other Ambulatory Visit: Payer: Self-pay | Admitting: Orthopedic Surgery

## 2022-08-05 DIAGNOSIS — R972 Elevated prostate specific antigen [PSA]: Secondary | ICD-10-CM

## 2022-08-05 DIAGNOSIS — M5442 Lumbago with sciatica, left side: Secondary | ICD-10-CM

## 2022-08-05 DIAGNOSIS — M4807 Spinal stenosis, lumbosacral region: Secondary | ICD-10-CM

## 2022-08-06 LAB — PSA: Prostate Specific Ag, Serum: 1.6 ng/mL (ref 0.0–4.0)

## 2022-08-08 ENCOUNTER — Ambulatory Visit: Payer: Medicare PPO | Admitting: Urology

## 2022-08-08 ENCOUNTER — Encounter: Payer: Self-pay | Admitting: Urology

## 2022-08-08 VITALS — BP 133/73 | HR 97 | Ht 66.0 in | Wt 125.0 lb

## 2022-08-08 DIAGNOSIS — N401 Enlarged prostate with lower urinary tract symptoms: Secondary | ICD-10-CM

## 2022-08-08 DIAGNOSIS — R972 Elevated prostate specific antigen [PSA]: Secondary | ICD-10-CM

## 2022-08-08 MED ORDER — DUTASTERIDE 0.5 MG PO CAPS: 0.5000 mg | ORAL_CAPSULE | Freq: Every day | ORAL | 3 refills | Status: DC

## 2022-08-08 NOTE — Progress Notes (Signed)
I, Maysun Anabel Bene, acting as a scribe for Riki Altes, MD., have documented all relevant documentation on the behalf of Riki Altes, MD, as directed by Riki Altes, MD while in the presence of Riki Altes, MD.  08/08/2022 11:36 AM   Garnette Scheuermann February 19, 1945 [id number]  Referring provider: Marina Goodell, MD [address] [place],  [place] [address]  Chief Complaint  Patient presents with   Elevated PSA   Urologic history: 1. Elevated PSA biopsies 2001 and 2002 for PSAs of 4.2 and 7.8; benign pathology. - Prostate MRI 05/2015 showed no suspicious lesions   2. BPH with lower urinary tract symptoms  started dutasteride 2008  HPI: Waring Woolard. is a 77 y.o. male presents for annual follow-up.   Stable lower urinary tract symptoms on dutasteride. Denies dysuria, gross hematuria Denies flank abdominal pelvic pain.  PSA 08/05/22 was 1.6 (uncorrected)  PSA trend   Prostate Specific Ag, Serum  Latest Ref Rng 0.0 - 4.0 ng/mL  07/30/2018 2.3   07/26/2019 1.9   08/01/2020 2.2   08/02/2021 2.4   08/05/2022 1.6      PMH: Past Medical History:  Diagnosis Date   Arthritis    Basal cell carcinoma 02/06/2022   right lat chin below lip, EDC   Headache    Hearing aid worn    bilateral   History of DVT (deep vein thrombosis)    History of kidney stones    h/o   Wears hearing aid in both ears     Surgical History: Past Surgical History:  Procedure Laterality Date   CATARACT EXTRACTION W/PHACO Right 12/05/2020   Procedure: CATARACT EXTRACTION PHACO AND INTRAOCULAR LENS PLACEMENT (IOC) RIGHT;  Surgeon: Lockie Mola, MD;  Location: [facility];  Service: Ophthalmology;  Laterality: Right;  17.32 01:45.1   CATARACT EXTRACTION W/PHACO Left 12/19/2020   Procedure: CATARACT EXTRACTION PHACO AND INTRAOCULAR LENS PLACEMENT (IOC) LEFT 7.40 00:58.8;  Surgeon: Lockie Mola, MD;  Location: [facility];  Service: Ophthalmology;   Laterality: Left;   COLONOSCOPY     COLONOSCOPY WITH PROPOFOL N/A 03/27/2016   Procedure: COLONOSCOPY WITH PROPOFOL;  Surgeon: Midge Minium, MD;  Location: [facility];  Service: Endoscopy;  Laterality: N/A;   FOOT SURGERY Right    HERNIA REPAIR Right    inguinal   INGUINAL HERNIA REPAIR Right 02/23/2018   Procedure: LAPAROSCOPIC INGUINAL HERNIA RECURRENT;  Surgeon: Sung Amabile, DO;  Location: [facility];  Service: General;  Laterality: Right;   KNEE SURGERY Right    POLYPECTOMY  03/27/2016   Procedure: POLYPECTOMY;  Surgeon: Midge Minium, MD;  Location: [facility];  Service: Endoscopy;;   REVERSE SHOULDER ARTHROPLASTY Right 10/06/2017   Procedure: REVERSE SHOULDER ARTHROPLASTY;  Surgeon: Christena Flake, MD;  Location: [facility];  Service: Orthopedics;  Laterality: Right;   SHOULDER ARTHROSCOPY WITH OPEN ROTATOR CUFF REPAIR Left 11/23/2018   Procedure: SHOULDER ARTHROSCOPY WITH DEBRIDEMENT, DECOMPRESSION, AND ROTATOR CUFF REPAIR.;  Surgeon: Christena Flake, MD;  Location: [facility];  Service: Orthopedics;  Laterality: Left;   SURGERY OF LIP      Home Medications:  Allergies as of 08/08/2022       Reactions   Sulfa Antibiotics Rash        Medication List        Accurate as of August 08, 2022 11:36 AM. If you have any questions, ask your nurse or doctor.  CALCIUM 600+D3 PO Take 1 tablet by mouth daily.   dutasteride 0.5 MG capsule Commonly known as: AVODART Take 1 capsule (0.5 mg total) by mouth at bedtime.   multivitamin with minerals Tabs tablet Take 1 tablet by mouth daily. Centrum Silver   mupirocin ointment 2 % Commonly known as: BACTROBAN Apply 1 Application topically daily. Qd to excision site   Vitamin B-12 5000 MCG Tbdp Take 5,000 mcg by mouth daily.   Vitamin D3 50 MCG (2000 UT) Tabs Take 2,000 Units by mouth daily.        Allergies:  Allergies  Allergen Reactions   Sulfa Antibiotics Rash    Family History: Family History   Problem Relation Age of Onset   Parkinson's disease Father     Social History:  reports that he has never smoked. He has never used smokeless tobacco. He reports that he does not currently use drugs. He reports that he does not drink alcohol.   Physical Exam: BP 133/73   Pulse 97   Ht 5\' 6"  (1.676 m)   Wt 125 lb (56.7 kg)   BMI 20.18 kg/m   Constitutional:  Alert and oriented, No acute distress. HEENT:  AT, moist mucus membranes.  Trachea midline, no masses. Cardiovascular: No clubbing, cyanosis, or edema. Respiratory: Normal respiratory effort, no increased work of breathing. GI: Abdomen is soft, nontender, nondistended, no abdominal masses Skin: No rashes, bruises or suspicious lesions. Neurologic: Grossly intact, no focal deficits, moving all 4 extremities. Psychiatric: Normal mood and affect.   Assessment & Plan:    1.  Elevated PSA We again discussed prostate cancer screening guidelines with recommendations between the ages of 83-69.  He has requested to continue annual screening Continue annual follow-up   2.  BPH with LUTS Stable Mild lower urinary tract symptoms Dutasteride refilled   [facility] [address] [place], [place] [address] [phone]

## 2022-08-19 ENCOUNTER — Encounter: Payer: Self-pay | Admitting: Orthopedic Surgery

## 2022-08-20 ENCOUNTER — Ambulatory Visit
Admission: RE | Admit: 2022-08-20 | Discharge: 2022-08-20 | Disposition: A | Discharge status: Home / Self Care | Payer: Medicare PPO | Source: Ambulatory Visit | Attending: Orthopedic Surgery | Admitting: Orthopedic Surgery

## 2022-08-20 DIAGNOSIS — M48061 Spinal stenosis, lumbar region without neurogenic claudication: Secondary | ICD-10-CM | POA: Diagnosis not present

## 2022-08-20 DIAGNOSIS — M4807 Spinal stenosis, lumbosacral region: Secondary | ICD-10-CM

## 2022-08-20 DIAGNOSIS — M5442 Lumbago with sciatica, left side: Secondary | ICD-10-CM

## 2022-08-28 DIAGNOSIS — H353131 Nonexudative age-related macular degeneration, bilateral, early dry stage: Secondary | ICD-10-CM | POA: Diagnosis not present

## 2022-08-28 DIAGNOSIS — H43812 Vitreous degeneration, left eye: Secondary | ICD-10-CM | POA: Diagnosis not present

## 2022-08-28 DIAGNOSIS — H35372 Puckering of macula, left eye: Secondary | ICD-10-CM | POA: Diagnosis not present

## 2022-08-28 DIAGNOSIS — Z961 Presence of intraocular lens: Secondary | ICD-10-CM | POA: Diagnosis not present

## 2022-09-08 DIAGNOSIS — M4807 Spinal stenosis, lumbosacral region: Secondary | ICD-10-CM | POA: Diagnosis not present

## 2022-09-08 DIAGNOSIS — M7752 Other enthesopathy of left foot: Secondary | ICD-10-CM | POA: Diagnosis not present

## 2022-09-16 DIAGNOSIS — M7752 Other enthesopathy of left foot: Secondary | ICD-10-CM | POA: Diagnosis not present

## 2022-09-29 DIAGNOSIS — M7752 Other enthesopathy of left foot: Secondary | ICD-10-CM | POA: Diagnosis not present

## 2022-09-29 DIAGNOSIS — M6281 Muscle weakness (generalized): Secondary | ICD-10-CM | POA: Diagnosis not present

## 2022-09-29 DIAGNOSIS — M25572 Pain in left ankle and joints of left foot: Secondary | ICD-10-CM | POA: Diagnosis not present

## 2022-10-07 DIAGNOSIS — M6281 Muscle weakness (generalized): Secondary | ICD-10-CM | POA: Diagnosis not present

## 2022-10-07 DIAGNOSIS — M25572 Pain in left ankle and joints of left foot: Secondary | ICD-10-CM | POA: Diagnosis not present

## 2022-10-07 DIAGNOSIS — M7752 Other enthesopathy of left foot: Secondary | ICD-10-CM | POA: Diagnosis not present

## 2022-10-14 DIAGNOSIS — M6281 Muscle weakness (generalized): Secondary | ICD-10-CM | POA: Diagnosis not present

## 2022-10-14 DIAGNOSIS — M25572 Pain in left ankle and joints of left foot: Secondary | ICD-10-CM | POA: Diagnosis not present

## 2022-10-14 DIAGNOSIS — M7752 Other enthesopathy of left foot: Secondary | ICD-10-CM | POA: Diagnosis not present

## 2022-10-16 DIAGNOSIS — M25572 Pain in left ankle and joints of left foot: Secondary | ICD-10-CM | POA: Diagnosis not present

## 2022-10-16 DIAGNOSIS — M6281 Muscle weakness (generalized): Secondary | ICD-10-CM | POA: Diagnosis not present

## 2022-10-16 DIAGNOSIS — M7752 Other enthesopathy of left foot: Secondary | ICD-10-CM | POA: Diagnosis not present

## 2022-12-15 DIAGNOSIS — E782 Mixed hyperlipidemia: Secondary | ICD-10-CM | POA: Diagnosis not present

## 2022-12-15 DIAGNOSIS — N138 Other obstructive and reflux uropathy: Secondary | ICD-10-CM | POA: Diagnosis not present

## 2022-12-15 DIAGNOSIS — N401 Enlarged prostate with lower urinary tract symptoms: Secondary | ICD-10-CM | POA: Diagnosis not present

## 2023-03-02 DIAGNOSIS — Z0100 Encounter for examination of eyes and vision without abnormal findings: Secondary | ICD-10-CM | POA: Diagnosis not present

## 2023-03-02 DIAGNOSIS — Z961 Presence of intraocular lens: Secondary | ICD-10-CM | POA: Diagnosis not present

## 2023-03-02 DIAGNOSIS — H35372 Puckering of macula, left eye: Secondary | ICD-10-CM | POA: Diagnosis not present

## 2023-03-02 DIAGNOSIS — H353131 Nonexudative age-related macular degeneration, bilateral, early dry stage: Secondary | ICD-10-CM | POA: Diagnosis not present

## 2023-03-31 DIAGNOSIS — H6123 Impacted cerumen, bilateral: Secondary | ICD-10-CM | POA: Diagnosis not present

## 2023-03-31 DIAGNOSIS — H903 Sensorineural hearing loss, bilateral: Secondary | ICD-10-CM | POA: Diagnosis not present

## 2023-04-17 DIAGNOSIS — B9789 Other viral agents as the cause of diseases classified elsewhere: Secondary | ICD-10-CM | POA: Diagnosis not present

## 2023-04-17 DIAGNOSIS — J069 Acute upper respiratory infection, unspecified: Secondary | ICD-10-CM | POA: Diagnosis not present

## 2023-04-29 ENCOUNTER — Ambulatory Visit: Payer: Medicare PPO | Admitting: Dermatology

## 2023-04-29 ENCOUNTER — Encounter: Payer: Self-pay | Admitting: Dermatology

## 2023-04-29 DIAGNOSIS — L814 Other melanin hyperpigmentation: Secondary | ICD-10-CM

## 2023-04-29 DIAGNOSIS — Z85828 Personal history of other malignant neoplasm of skin: Secondary | ICD-10-CM

## 2023-04-29 DIAGNOSIS — L578 Other skin changes due to chronic exposure to nonionizing radiation: Secondary | ICD-10-CM

## 2023-04-29 DIAGNOSIS — D229 Melanocytic nevi, unspecified: Secondary | ICD-10-CM

## 2023-04-29 DIAGNOSIS — L570 Actinic keratosis: Secondary | ICD-10-CM

## 2023-04-29 DIAGNOSIS — W908XXA Exposure to other nonionizing radiation, initial encounter: Secondary | ICD-10-CM | POA: Diagnosis not present

## 2023-04-29 DIAGNOSIS — L820 Inflamed seborrheic keratosis: Secondary | ICD-10-CM

## 2023-04-29 DIAGNOSIS — Z1283 Encounter for screening for malignant neoplasm of skin: Secondary | ICD-10-CM | POA: Diagnosis not present

## 2023-04-29 DIAGNOSIS — L821 Other seborrheic keratosis: Secondary | ICD-10-CM

## 2023-04-29 DIAGNOSIS — D1801 Hemangioma of skin and subcutaneous tissue: Secondary | ICD-10-CM

## 2023-04-29 NOTE — Progress Notes (Signed)
 Follow-Up Visit   Subjective  Tyler Woods. is a 78 y.o. male who presents for the following: Skin Cancer Screening and Upper Body Skin Exam  The patient presents for Upper Body Skin Exam (UBSE) for skin cancer screening and mole check. The patient has spots, moles and lesions to be evaluated, some may be new or changing and the patient may have concern these could be cancer.  The following portions of the chart were reviewed this encounter and updated as appropriate: medications, allergies, medical history  Review of Systems:  No other skin or systemic complaints except as noted in HPI or Assessment and Plan.  Objective  Well appearing patient in no apparent distress; mood and affect are within normal limits.  All skin waist up examined. Relevant physical exam findings are noted in the Assessment and Plan.  L hand x 4 Erythematous stuck-on, waxy papule or plaque L cheek inf preauricular x 2, L hand x 2 (4) Erythematous thin papules/macules with gritty scale.   Assessment & Plan   INFLAMED SEBORRHEIC KERATOSIS L hand x 4 Symptomatic, irritating, patient would like treated.   Destruction of lesion - L hand x 4 Complexity: simple   Destruction method: cryotherapy   Informed consent: discussed and consent obtained   Timeout:  patient name, date of birth, surgical site, and procedure verified Lesion destroyed using liquid nitrogen: Yes   Region frozen until ice ball extended beyond lesion: Yes   Outcome: patient tolerated procedure well with no complications   Post-procedure details: wound care instructions given   AK (ACTINIC KERATOSIS) (4) L cheek inf preauricular x 2, L hand x 2 (4) Actinic keratoses are precancerous spots that appear secondary to cumulative UV radiation exposure/sun exposure over time. They are chronic with expected duration over 1 year. A portion of actinic keratoses will progress to squamous cell carcinoma of the skin. It is not possible to  reliably predict which spots will progress to skin cancer and so treatment is recommended to prevent development of skin cancer.  Recommend daily broad spectrum sunscreen SPF 30+ to sun-exposed areas, reapply every 2 hours as needed.  Recommend staying in the shade or wearing long sleeves, sun glasses (UVA+UVB protection) and wide brim hats (4-inch brim around the entire circumference of the hat). Call for new or changing lesions.  Destruction of lesion - L cheek inf preauricular x 2, L hand x 2 (4) Complexity: simple   Destruction method: cryotherapy   Informed consent: discussed and consent obtained   Timeout:  patient name, date of birth, surgical site, and procedure verified Lesion destroyed using liquid nitrogen: Yes   Region frozen until ice ball extended beyond lesion: Yes   Outcome: patient tolerated procedure well with no complications   Post-procedure details: wound care instructions given    Actinic Damage - Chronic condition, secondary to cumulative UV/sun exposure - diffuse scaly erythematous macules with underlying dyspigmentation - Recommend daily broad spectrum sunscreen SPF 30+ to sun-exposed areas, reapply every 2 hours as needed.  - Staying in the shade or wearing long sleeves, sun glasses (UVA+UVB protection) and wide brim hats (4-inch brim around the entire circumference of the hat) are also recommended for sun protection.  - Call for new or changing lesions.  Lentigines, Seborrheic Keratoses, Hemangiomas - Benign normal skin lesions - Benign-appearing - Call for any changes  Melanocytic Nevi - Tan-brown and/or pink-flesh-colored symmetric macules and papules - Benign appearing on exam today - Observation - Call clinic for new or  changing moles - Recommend daily use of broad spectrum spf 30+ sunscreen to sun-exposed areas.   HISTORY OF BASAL CELL CARCINOMA OF THE SKIN - R lat chin below the lip 02/06/22 - No evidence of recurrence today - Recommend regular  full body skin exams - Recommend daily broad spectrum sunscreen SPF 30+ to sun-exposed areas, reapply every 2 hours as needed.  - Call if any new or changing lesions are noted between office visits  Skin cancer screening performed today.  Return in about 1 year (around 04/28/2024) for TBSE.  Arlinda Lais, CMA, am acting as scribe for Celine Collard, MD .  Documentation: I have reviewed the above documentation for accuracy and completeness, and I agree with the above.  Celine Collard, MD

## 2023-04-29 NOTE — Patient Instructions (Signed)

## 2023-06-17 DIAGNOSIS — N401 Enlarged prostate with lower urinary tract symptoms: Secondary | ICD-10-CM | POA: Diagnosis not present

## 2023-06-17 DIAGNOSIS — E782 Mixed hyperlipidemia: Secondary | ICD-10-CM | POA: Diagnosis not present

## 2023-06-17 DIAGNOSIS — N138 Other obstructive and reflux uropathy: Secondary | ICD-10-CM | POA: Diagnosis not present

## 2023-06-17 DIAGNOSIS — Z0000 Encounter for general adult medical examination without abnormal findings: Secondary | ICD-10-CM | POA: Diagnosis not present

## 2023-08-05 ENCOUNTER — Other Ambulatory Visit: Payer: Self-pay

## 2023-08-05 DIAGNOSIS — R972 Elevated prostate specific antigen [PSA]: Secondary | ICD-10-CM

## 2023-08-06 LAB — PSA: Prostate Specific Ag, Serum: 2.1 ng/mL (ref 0.0–4.0)

## 2023-08-07 ENCOUNTER — Encounter: Payer: Self-pay | Admitting: Urology

## 2023-08-07 ENCOUNTER — Ambulatory Visit: Payer: Self-pay | Admitting: Urology

## 2023-08-07 VITALS — BP 136/66 | HR 74 | Ht 66.0 in | Wt 128.0 lb

## 2023-08-07 DIAGNOSIS — N401 Enlarged prostate with lower urinary tract symptoms: Secondary | ICD-10-CM

## 2023-08-07 MED ORDER — DUTASTERIDE 0.5 MG PO CAPS: 0.5000 mg | ORAL_CAPSULE | Freq: Every day | ORAL | 3 refills | Status: AC

## 2023-08-07 NOTE — Progress Notes (Signed)
 08/07/2023 9:17 AM   Tyler Woods 04-21-1945 [id number]  Referring provider: Jeffie Cheryl BRAVO, MD [address] [place],  [place] [address]  Chief Complaint  Patient presents with   Follow-up   Urologic history: 1. Elevated PSA biopsies 2001 and 2002 for PSAs of 4.2 and 7.8; benign pathology. - Prostate MRI 05/2015 showed no suspicious lesions   2. BPH with lower urinary tract symptoms  started dutasteride  2008  HPI: Tyler Woods. is a 78 y.o. male presents for annual follow-up.   Stable lower urinary tract symptoms on dutasteride . Denies dysuria, gross hematuria Denies flank abdominal pelvic pain.  PSA 08/05/23 was 2.1 (uncorrected)  PSA trend   Prostate Specific Ag, Serum  Latest Ref Rng 0.0 - 4.0 ng/mL  07/30/2018 2.3   07/26/2019 1.9   08/01/2020 2.2   08/02/2021 2.4   08/05/2022 1.6   08/05/2023 2.1     PMH: Past Medical History:  Diagnosis Date   Arthritis    Basal cell carcinoma 02/06/2022   right lat chin below lip, EDC   Headache    Hearing aid worn    bilateral   History of DVT (deep vein thrombosis)    History of kidney stones    h/o   Wears hearing aid in both ears     Surgical History: Past Surgical History:  Procedure Laterality Date   CATARACT EXTRACTION W/PHACO Right 12/05/2020   Procedure: CATARACT EXTRACTION PHACO AND INTRAOCULAR LENS PLACEMENT (IOC) RIGHT;  Surgeon: Mittie Gaskin, MD;  Location: [facility];  Service: Ophthalmology;  Laterality: Right;  17.32 01:45.1   CATARACT EXTRACTION W/PHACO Left 12/19/2020   Procedure: CATARACT EXTRACTION PHACO AND INTRAOCULAR LENS PLACEMENT (IOC) LEFT 7.40 00:58.8;  Surgeon: Mittie Gaskin, MD;  Location: [facility];  Service: Ophthalmology;  Laterality: Left;   COLONOSCOPY     COLONOSCOPY WITH PROPOFOL  N/A 03/27/2016   Procedure: COLONOSCOPY WITH PROPOFOL ;  Surgeon: Rogelia Copping, MD;  Location: [facility];  Service: Endoscopy;  Laterality: N/A;    FOOT SURGERY Right    HERNIA REPAIR Right    inguinal   INGUINAL HERNIA REPAIR Right 02/23/2018   Procedure: LAPAROSCOPIC INGUINAL HERNIA RECURRENT;  Surgeon: Tye Millet, DO;  Location: [facility];  Service: General;  Laterality: Right;   KNEE SURGERY Right    POLYPECTOMY  03/27/2016   Procedure: POLYPECTOMY;  Surgeon: Rogelia Copping, MD;  Location: [facility];  Service: Endoscopy;;   REVERSE SHOULDER ARTHROPLASTY Right 10/06/2017   Procedure: REVERSE SHOULDER ARTHROPLASTY;  Surgeon: Edie Norleen PARAS, MD;  Location: [facility];  Service: Orthopedics;  Laterality: Right;   SHOULDER ARTHROSCOPY WITH OPEN ROTATOR CUFF REPAIR Left 11/23/2018   Procedure: SHOULDER ARTHROSCOPY WITH DEBRIDEMENT, DECOMPRESSION, AND ROTATOR CUFF REPAIR.;  Surgeon: Edie Norleen PARAS, MD;  Location: [facility];  Service: Orthopedics;  Laterality: Left;   SURGERY OF LIP      Home Medications:  Allergies as of 08/07/2023       Reactions   Sulfa Antibiotics Rash        Medication List        Accurate as of August 07, 2023  9:17 AM. If you have any questions, ask your nurse or doctor.          CALCIUM  600+D3 PO Take 1 tablet by mouth daily.   dutasteride  0.5 MG capsule Commonly known as: AVODART  Take 1 capsule (0.5 mg total) by mouth at bedtime.   multivitamin with minerals Tabs tablet Take 1  tablet by mouth daily. Centrum Silver   mupirocin  ointment 2 % Commonly known as: BACTROBAN  Apply 1 Application topically daily. Qd to excision site   PRESERVISION AREDS PO Take by mouth.   Vitamin B-12 5000 MCG Tbdp Take 5,000 mcg by mouth daily.   Vitamin D3 50 MCG (2000 UT) Tabs Take 2,000 Units by mouth daily.        Allergies:  Allergies  Allergen Reactions   Sulfa Antibiotics Rash    Family History: Family History  Problem Relation Age of Onset   Parkinson's disease Father     Social History:  reports that he has never smoked. He has never used smokeless tobacco. He reports that he does  not currently use drugs. He reports that he does not drink alcohol.   Physical Exam: BP 136/66   Pulse 74   Ht 5' 6 (1.676 m)   Wt 128 lb (58.1 kg)   BMI 20.66 kg/m   Constitutional:  Alert and oriented, No acute distress. HEENT: [place] AT Respiratory: Normal respiratory effort, no increased work of breathing. Psychiatric: Normal mood and affect.   Assessment & Plan:    1.  Elevated PSA We again discussed prostate cancer screening guidelines with recommendations between the ages of 82-69.   Although some guidelines recommend discontinuing PSA screening at age 56 it is universally recommended to PSA screening in the 47s   2.  BPH with LUTS Stable Mild lower urinary tract symptoms Dutasteride  refilled  Annual follow-up for BPH refills   Tyler JAYSON Barba, MD  [facility] [address], [address] [place], [place] [address] [phone]

## 2023-09-01 DIAGNOSIS — H353131 Nonexudative age-related macular degeneration, bilateral, early dry stage: Secondary | ICD-10-CM | POA: Diagnosis not present

## 2023-09-01 DIAGNOSIS — H35373 Puckering of macula, bilateral: Secondary | ICD-10-CM | POA: Diagnosis not present

## 2023-09-01 DIAGNOSIS — Z0100 Encounter for examination of eyes and vision without abnormal findings: Secondary | ICD-10-CM | POA: Diagnosis not present

## 2023-09-01 DIAGNOSIS — Z961 Presence of intraocular lens: Secondary | ICD-10-CM | POA: Diagnosis not present

## 2024-05-03 ENCOUNTER — Ambulatory Visit: Admitting: Dermatology

## 2024-08-05 ENCOUNTER — Ambulatory Visit: Admitting: Urology
